# Patient Record
Sex: Male | Born: 1970 | Race: Black or African American | Hispanic: No | Marital: Married | Smoking: Never smoker
Health system: Southern US, Community
[De-identification: ages and names within clinical notes are randomized; demographics above are authoritative.]

## PROBLEM LIST (undated history)

## (undated) DIAGNOSIS — D869 Sarcoidosis, unspecified: Secondary | ICD-10-CM

## (undated) DIAGNOSIS — I10 Essential (primary) hypertension: Secondary | ICD-10-CM

## (undated) DIAGNOSIS — T7840XA Allergy, unspecified, initial encounter: Secondary | ICD-10-CM

## (undated) HISTORY — DX: Allergy, unspecified, initial encounter: T78.40XA

---

## 2004-03-15 ENCOUNTER — Emergency Department (HOSPITAL_COMMUNITY): Admission: EM | Admit: 2004-03-15 | Discharge: 2004-03-15 | Payer: Self-pay | Admitting: Emergency Medicine

## 2004-10-03 ENCOUNTER — Emergency Department (HOSPITAL_COMMUNITY): Admission: EM | Admit: 2004-10-03 | Discharge: 2004-10-03 | Payer: Self-pay | Admitting: Emergency Medicine

## 2004-11-17 ENCOUNTER — Emergency Department (HOSPITAL_COMMUNITY): Admission: EM | Admit: 2004-11-17 | Discharge: 2004-11-18 | Payer: Self-pay | Admitting: Emergency Medicine

## 2004-11-18 ENCOUNTER — Emergency Department (HOSPITAL_COMMUNITY): Admission: EM | Admit: 2004-11-18 | Discharge: 2004-11-18 | Payer: Self-pay | Admitting: Emergency Medicine

## 2004-12-07 ENCOUNTER — Emergency Department (HOSPITAL_COMMUNITY): Admission: EM | Admit: 2004-12-07 | Discharge: 2004-12-07 | Payer: Self-pay

## 2005-02-15 ENCOUNTER — Emergency Department (HOSPITAL_COMMUNITY): Admission: EM | Admit: 2005-02-15 | Discharge: 2005-02-15 | Payer: Self-pay | Admitting: Emergency Medicine

## 2005-07-23 ENCOUNTER — Emergency Department (HOSPITAL_COMMUNITY): Admission: EM | Admit: 2005-07-23 | Discharge: 2005-07-23 | Payer: Self-pay | Admitting: Emergency Medicine

## 2006-03-03 ENCOUNTER — Emergency Department (HOSPITAL_COMMUNITY): Admission: EM | Admit: 2006-03-03 | Discharge: 2006-03-03 | Payer: Self-pay | Admitting: Emergency Medicine

## 2006-08-08 ENCOUNTER — Emergency Department (HOSPITAL_COMMUNITY): Admission: EM | Admit: 2006-08-08 | Discharge: 2006-08-08 | Payer: Self-pay | Admitting: Emergency Medicine

## 2006-10-10 ENCOUNTER — Emergency Department (HOSPITAL_COMMUNITY): Admission: EM | Admit: 2006-10-10 | Discharge: 2006-10-10 | Payer: Self-pay | Admitting: Emergency Medicine

## 2007-03-18 ENCOUNTER — Encounter: Admission: RE | Admit: 2007-03-18 | Discharge: 2007-03-18 | Payer: Self-pay | Admitting: Family Medicine

## 2007-08-11 ENCOUNTER — Emergency Department (HOSPITAL_COMMUNITY): Admission: EM | Admit: 2007-08-11 | Discharge: 2007-08-11 | Payer: Self-pay | Admitting: Family Medicine

## 2008-01-30 ENCOUNTER — Emergency Department (HOSPITAL_COMMUNITY): Admission: EM | Admit: 2008-01-30 | Discharge: 2008-01-30 | Payer: Self-pay | Admitting: Emergency Medicine

## 2008-04-13 ENCOUNTER — Emergency Department (HOSPITAL_COMMUNITY): Admission: EM | Admit: 2008-04-13 | Discharge: 2008-04-13 | Payer: Self-pay | Admitting: Emergency Medicine

## 2009-01-13 ENCOUNTER — Emergency Department (HOSPITAL_COMMUNITY): Admission: EM | Admit: 2009-01-13 | Discharge: 2009-01-13 | Payer: Self-pay | Admitting: Family Medicine

## 2009-02-04 ENCOUNTER — Emergency Department (HOSPITAL_COMMUNITY): Admission: EM | Admit: 2009-02-04 | Discharge: 2009-02-04 | Payer: Self-pay | Admitting: Emergency Medicine

## 2009-08-05 ENCOUNTER — Emergency Department (HOSPITAL_COMMUNITY): Admission: EM | Admit: 2009-08-05 | Discharge: 2009-08-05 | Payer: Self-pay | Admitting: Family Medicine

## 2010-04-20 ENCOUNTER — Ambulatory Visit: Payer: Self-pay | Admitting: Internal Medicine

## 2010-04-20 DIAGNOSIS — I10 Essential (primary) hypertension: Secondary | ICD-10-CM | POA: Insufficient documentation

## 2010-04-20 DIAGNOSIS — D869 Sarcoidosis, unspecified: Secondary | ICD-10-CM

## 2011-01-10 NOTE — Assessment & Plan Note (Signed)
Summary: Pulmonary/ new pt eval with sarcoidosis   Visit Type:  Initial Consult Copy to:  Dr. Jimmye Norman Primary Provider/Referring Provider:  Dr. Jimmye Norman  CC:  Sarcoid.  History of Present Illness: 38 yobm quit smoking when dx'd with sarcoid 1998 in [place] with Iritis and pos lymph node bx by mediastinoscopy  never needed prednisone.  Apr 20, 2010 cc chest pain with twisting and turning x 1 week, no new eye problems, no new arthritisi or rash, no sob, no cough.  Pt denies any significant sore throat, dysphagia, itching, sneezing,  nasal congestion or excess secretions,  fever, chills, sweats, unintended wt loss, pleuritic or exertional cp, hempoptysis, change in activity tolerance  orthopnea pnd or leg swelling.  Pt also denies any obvious fluctuation in symptoms with weather or environmental change or other alleviating or aggravating factors.        Current Medications (verified): 1)  Hydrochlorothiazide 12.5 Mg Caps (Hydrochlorothiazide) .Marland Kitchen.. 1 Once Daily 2)  Flarex 0.1 % Susp (Fluorometholone Acetate) .... 4 Drops Daily To Eac Eye As Needed  Allergies (verified): No Known Drug Allergies  Past History:  Past Medical History: Hypertension Sarcoidosis..............................................Marland KitchenWert     - dx 1998  [place] by mediastinoscopy  Family History: Stomach CA- Father Sarcoid- Mother  Social History: Married Children Works for [employer] Former smoker.  Quit in 1998.  Smoked for approx 4 yrs up to 1/2 ppd. ETOH 2 x per wk  Review of Systems       The patient complains of chest pain, acid heartburn, indigestion, headaches, nasal congestion/difficulty breathing through nose, anxiety, and joint stiffness or pain.  The patient denies shortness of breath with activity, shortness of breath at rest, productive cough, non-productive cough, coughing up blood, irregular heartbeats, loss of appetite, weight change, abdominal pain, difficulty swallowing,  sore throat, tooth/dental problems, sneezing, itching, ear ache, depression, hand/feet swelling, rash, change in color of mucus, and fever.    Vital Signs:  Patient profile:   40 year old male Height:      75 inches Weight:      301 pounds BMI:     37.76 O2 Sat:      98 % on Room air Temp:     98.2 degrees F oral Pulse rate:   80 / minute BP sitting:   124 / 86  (left arm) Cuff size:   large  Vitals Entered ByVernie Murders (Apr 20, 2010 3:40 PM)  O2 Flow:  Room air  Physical Exam  Additional Exam:  Healthy appearing mod obese bm nad wt 301 Apr 20, 2010  HEENT: nl dentition, turbinates, and orophanx. Nl external ear canals without cough reflex NECK :  without JVD/Nodes/TM/ nl carotid upstrokes bilaterally LUNGS: no acc muscle use, clear to A and P bilaterally without cough on insp or exp maneuvers CV:  RRR  no s3 or murmur or increase in P2, no edema  ABD:  soft and nontender with nl excursion in the supine position. No bruits or organomegaly, bowel sounds nl MS:  warm without deformities, calf tenderness, cyanosis or clubbing SKIN: warm and dry with hyperpigmented macular changes both ext elbows  NEURO:  alert, approp, no deficits     CXR  Procedure date:  04/20/2010  Findings:      Comparison: 03/18/2007   Findings: Normal heart size and vascularity.  No focal pneumonia, edema, airspace disease, effusion or pneumothorax.  Midline trachea.  No adenopathy by plain radiography.  Stable exam.   IMPRESSION:  Stable chest exam.  No acute process.  Impression & Recommendations:  Problem # 1:  SARCOIDOSIS (ICD-135) A good general rule of thumb is that 95% of pts with active sarcoid have an abn cxr (which he doesn't) and most pts with sarcoid with abn cxr don't needed to be treated with steroids because there's little evidence that we change the natural hx of the disease with steroids but cause considerable collateral damage with this drug chronically.   therefore f/u  yearly with cxr and ocular exam all that's required.  Medications Added to Medication List This Visit: 1)  Hydrochlorothiazide 12.5 Mg Caps (Hydrochlorothiazide) .Marland Kitchen.. 1 once daily 2)  Flarex 0.1 % Susp (Fluorometholone acetate) .... 4 drops daily to eac eye as needed  Other Orders: T-2 View CXR (71020TC) Consultation Level IV (96295)  Patient Instructions: 1)  Yearly cxr in context of a regular exam and eye exam 2)  Call sooner for unintended weight loss, unexplained sweats and fever, short of breath  or cough   CXR  Procedure date:  04/20/2010  Findings:      Comparison: 03/18/2007   Findings: Normal heart size and vascularity.  No focal pneumonia, edema, airspace disease, effusion or pneumothorax.  Midline trachea.  No adenopathy by plain radiography.  Stable exam.   IMPRESSION: Stable chest exam.  No acute process.

## 2011-09-01 LAB — POCT URINALYSIS DIP (DEVICE)
Ketones, ur: NEGATIVE
Nitrite: NEGATIVE
Protein, ur: NEGATIVE

## 2011-09-01 LAB — URINE CULTURE: Colony Count: NO GROWTH

## 2011-09-01 LAB — GC/CHLAMYDIA PROBE AMP, GENITAL
Chlamydia, DNA Probe: NEGATIVE
GC Probe Amp, Genital: NEGATIVE

## 2011-09-22 LAB — POCT URINALYSIS DIP (DEVICE)
Glucose, UA: NEGATIVE
Protein, ur: NEGATIVE
Urobilinogen, UA: 0.2

## 2011-10-21 ENCOUNTER — Emergency Department (INDEPENDENT_AMBULATORY_CARE_PROVIDER_SITE_OTHER)
Admission: EM | Admit: 2011-10-21 | Discharge: 2011-10-21 | Disposition: A | Discharge status: Home / Self Care | Payer: BC Managed Care – PPO | Source: Home / Self Care | Admitting: Family Medicine

## 2011-10-21 DIAGNOSIS — T7840XA Allergy, unspecified, initial encounter: Secondary | ICD-10-CM

## 2011-10-21 HISTORY — DX: Sarcoidosis, unspecified: D86.9

## 2011-10-21 HISTORY — DX: Essential (primary) hypertension: I10

## 2011-10-21 NOTE — ED Provider Notes (Signed)
Medical screening examination/treatment/procedure(s) were performed by non-physician practitioner and as supervising physician I was immediately available for consultation/collaboration.   [facility]; MD   Mariyana Fulop Moreno-Coll 10/21/11 1478

## 2011-10-21 NOTE — ED Provider Notes (Signed)
History     CSN: [id number] Arrival date & time: 10/21/2011  9:36 AM   First MD Initiated Contact with Patient 10/21/11 1005      Chief Complaint  Patient presents with  . Allergic Reaction    Pt has burning and swelling of lips that started this am and denies new meds, concerned because he had oral sex last pm    (Consider location/radiation/quality/duration/timing/severity/associated sxs/prior treatment) HPI Comments: Pt woke up this morning feeling like his lips were burning and swollen; feels almost completely better now; tx self by using lip balm; denies any new foods or medications  Patient is a 40 y.o. male presenting with hypersensitivity reaction. The history is provided by the patient.  Hypersensitivity Reaction The primary symptoms are  angioedema. The primary symptoms do not include wheezing, shortness of breath or rash. The current episode started 3 to 5 hours ago. The problem has been rapidly improving. This is a new problem.  The angioedema is not associated with shortness of breath or stridor.  Significant symptoms that are not present include eye redness, flushing, rhinorrhea or itching.    Past Medical History  Diagnosis Date  . Hypertension   . Sarcoidosis     History reviewed. No pertinent past surgical history.  History reviewed. No pertinent family history.  History  Substance Use Topics  . Smoking status: Never Smoker   . Smokeless tobacco: Never Used  . Alcohol Use: Yes      Review of Systems  Constitutional: Negative for fever and chills.  HENT: Negative for facial swelling, rhinorrhea and trouble swallowing.   Eyes: Negative for redness.  Respiratory: Negative for chest tightness, shortness of breath, wheezing and stridor.   Cardiovascular: Negative for chest pain.  Skin: Negative for flushing, itching and rash.    Allergies  Review of patient's allergies indicates no known allergies.  Home Medications   Current Outpatient Rx  Name  Route Sig Dispense Refill  . HYDROCHLOROTHIAZIDE 12.5 MG PO TABS Oral Take 12.5 mg by mouth daily.        BP 136/90  Pulse 83  Temp(Src) 98.6 F (37 C) (Oral)  Resp 17  SpO2 96%  Physical Exam  Constitutional: He appears well-developed and well-nourished. No distress.  HENT:  Head: Normocephalic.  Mouth/Throat: No uvula swelling. No oropharyngeal exudate or posterior oropharyngeal edema.       No lip swelling noted  Pulmonary/Chest: Effort normal and breath sounds normal.    ED Course  Procedures (including critical care time)  Labs Reviewed - No data to display No results found.   No diagnosis found.    MDM  No evidence of allergic reaction at this time.  Pt does feel his lips were very dry and sx were relieved with lip balm.  Allergic reaction vs dry lips.         Cathlyn Parsons, NP 10/21/11 1058

## 2014-09-17 ENCOUNTER — Encounter (HOSPITAL_COMMUNITY): Payer: Self-pay | Admitting: Emergency Medicine

## 2014-09-17 ENCOUNTER — Emergency Department (HOSPITAL_COMMUNITY)
Admission: EM | Admit: 2014-09-17 | Discharge: 2014-09-17 | Disposition: A | Discharge status: Home / Self Care | Payer: BC Managed Care – PPO | Attending: Emergency Medicine | Admitting: Emergency Medicine

## 2014-09-17 DIAGNOSIS — Z862 Personal history of diseases of the blood and blood-forming organs and certain disorders involving the immune mechanism: Secondary | ICD-10-CM | POA: Insufficient documentation

## 2014-09-17 DIAGNOSIS — Z79899 Other long term (current) drug therapy: Secondary | ICD-10-CM | POA: Insufficient documentation

## 2014-09-17 DIAGNOSIS — I10 Essential (primary) hypertension: Secondary | ICD-10-CM | POA: Insufficient documentation

## 2014-09-17 DIAGNOSIS — M545 Low back pain: Secondary | ICD-10-CM | POA: Insufficient documentation

## 2014-09-17 MED ORDER — IBUPROFEN 800 MG PO TABS: 800.0000 mg | ORAL_TABLET | Freq: Three times a day (TID) | ORAL | Status: DC | PRN

## 2014-09-17 MED ORDER — CYCLOBENZAPRINE HCL 10 MG PO TABS: 10.0000 mg | ORAL_TABLET | Freq: Three times a day (TID) | ORAL | Status: DC | PRN

## 2014-09-17 MED ORDER — OXYCODONE-ACETAMINOPHEN 5-325 MG PO TABS: 1.0000 | ORAL_TABLET | Freq: Three times a day (TID) | ORAL | Status: DC | PRN

## 2014-09-17 NOTE — ED Provider Notes (Signed)
CSN: [id number]     Arrival date & time 09/17/14  3790 History  This chart was scribed for non-physician practitioner Domenic Moras, working with Tanna Furry, MD by Donato Schultz, ED Scribe. This patient was seen in room TR06C/TR06C and the patient's care was started at 10:45 AM.   No chief complaint on file.   The history is provided by the patient. No language interpreter was used.   HPI Comments: Aaron Jacobs is a 43 y.o. male with a 10 year history of intermittent lower back spasms and sarcoidosis who presents to the Emergency Department complaining of gradually worsening lower back pain with associated spasms with intermittent radiation to the anterior aspect of his thighs bilaterally that started 2 weeks ago.  He rates the pain at a 3/10 but states that it was a 10/10 yesterday.  He states that positional changes and standing up after sitting aggravate the pain.  He has been taking Ibuprofen with mild relief to his symptoms and Flexeril with temporary relief, but improvement to his symptoms.  He used to go to physical therapy for his symptoms and saw improvement in his pain however he has not gone in the last 3 years.  He denies incontinence, hematuria, and numbness in his legs bilaterally as an associated symptom.  He does not have a history of IV drug use or cancer.  He has never used medication to treat the sarcoidosis.  He has never seen an orthopedist.  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     Past Medical History  Diagnosis Date  . Hypertension   . Sarcoidosis    History reviewed. No pertinent past surgical history. History reviewed. No pertinent family history. History  Substance Use Topics  . Smoking status: Never Smoker   . Smokeless tobacco: Never Used  . Alcohol Use: Yes    Review of Systems  Genitourinary: Negative for hematuria.  Musculoskeletal: Positive for back pain.  Neurological: Negative for numbness.  All other systems  reviewed and are negative.     Allergies  Review of patient's allergies indicates no known allergies.  Home Medications   Prior to Admission medications   Medication Sig Start Date End Date Taking? Authorizing Provider  cyclobenzaprine (FLEXERIL) 10 MG tablet Take 10 mg by mouth 3 (three) times daily as needed for muscle spasms.   Yes Historical Provider, MD  hydrochlorothiazide (HYDRODIURIL) 12.5 MG tablet Take 12.5 mg by mouth daily.     Yes Historical Provider, MD   BP 152/93  Pulse 92  Temp(Src) 98.6 F (37 C) (Oral)  Resp 20  Ht 6\' 4"  (1.93 m)  Wt 305 lb (138.347 kg)  BMI 37.14 kg/m2  SpO2 95% Physical Exam  Nursing note and vitals reviewed. Constitutional: He is oriented to person, place, and time. He appears well-developed and well-nourished.  HENT:  Head: Normocephalic and atraumatic.  Eyes: EOM are normal.  Neck: Normal range of motion.  Cardiovascular: Normal rate.   Pulmonary/Chest: Effort normal.  Musculoskeletal: Normal range of motion.  Mild para lumbar spine tenderness on palpation.  No significant midline spine tenderness, crepitus, or step off.  Negative straight leg raise.   Neurological: He is alert and oriented to person, place, and time. He has normal reflexes. Coordination normal.  Skin: Skin is warm and dry. No rash noted.  No signs of infection.  Psychiatric: He has a normal mood and affect. His behavior is normal.    ED Course  Procedures (including critical care time)  DIAGNOSTIC STUDIES: Oxygen Saturation is 95% on room air, adequate by my interpretation.    COORDINATION OF CARE: 11:02 AM- acute on chronic low back pain, no red flags, ambulate without difficulty, nvi.  Will discharge the patient with pain medication and a referral to an orthopedist.  Advised the patient to follow-up with physical therapy and discussed return to clinic precautions.  The patient agreed to the treatment plan.  Labs Review Labs Reviewed - No data to  display  Imaging Review No results found.   EKG Interpretation None      MDM   Final diagnoses:  Low back pain without sciatica, unspecified back pain laterality    BP 152/93  Pulse 92  Temp(Src) 98.6 F (37 C) (Oral)  Resp 20  Ht 6\' 4"  (1.93 m)  Wt 305 lb (138.347 kg)  BMI 37.14 kg/m2  SpO2 95%   I personally performed the services described in this documentation, which was scribed in my presence. The recorded information has been reviewed and is accurate.     Domenic Moras, PA-C 09/17/14 1111

## 2014-09-17 NOTE — ED Notes (Signed)
Pt presents with 2 week h/o low back spasms.  Pt denies any injury but reports lifting at his job. Pt reports pain radiates down L leg; pt denies any bladder/bowel incontinence, reports taking flexeril last night which relieved pain only to awake with tightening in back again.

## 2014-09-17 NOTE — Discharge Instructions (Signed)
Back Pain, Adult Low back pain is very common. About 1 in 5 people have back pain.The cause of low back pain is rarely dangerous. The pain often gets better over time.About half of people with a sudden onset of back pain feel better in just 2 weeks. About 8 in 10 people feel better by 6 weeks.  CAUSES Some common causes of back pain include:  Strain of the muscles or ligaments supporting the spine.  Wear and tear (degeneration) of the spinal discs.  Arthritis.  Direct injury to the back. DIAGNOSIS Most of the time, the direct cause of low back pain is not known.However, back pain can be treated effectively even when the exact cause of the pain is unknown.Answering your caregiver's questions about your overall health and symptoms is one of the most accurate ways to make sure the cause of your pain is not dangerous. If your caregiver needs more information, he or she may order lab work or imaging tests (X-rays or MRIs).However, even if imaging tests show changes in your back, this usually does not require surgery. HOME CARE INSTRUCTIONS For many people, back pain returns.Since low back pain is rarely dangerous, it is often a condition that people can learn to manageon their own.   Remain active. It is stressful on the back to sit or stand in one place. Do not sit, drive, or stand in one place for more than 30 minutes at a time. Take short walks on level surfaces as soon as pain allows.Try to increase the length of time you walk each day.  Do not stay in bed.Resting more than 1 or 2 days can delay your recovery.  Do not avoid exercise or work.Your body is made to move.It is not dangerous to be active, even though your back may hurt.Your back will likely heal faster if you return to being active before your pain is gone.  Pay attention to your body when you bend and lift. Many people have less discomfortwhen lifting if they bend their knees, keep the load close to their bodies,and  avoid twisting. Often, the most comfortable positions are those that put less stress on your recovering back.  Find a comfortable position to sleep. Use a firm mattress and lie on your side with your knees slightly bent. If you lie on your back, put a pillow under your knees.  Only take over-the-counter or prescription medicines as directed by your caregiver. Over-the-counter medicines to reduce pain and inflammation are often the most helpful.Your caregiver may prescribe muscle relaxant drugs.These medicines help dull your pain so you can more quickly return to your normal activities and healthy exercise.  Put ice on the injured area.  Put ice in a plastic bag.  Place a towel between your skin and the bag.  Leave the ice on for 15-20 minutes, 03-04 times a day for the first 2 to 3 days. After that, ice and heat may be alternated to reduce pain and spasms.  Ask your caregiver about trying back exercises and gentle massage. This may be of some benefit.  Avoid feeling anxious or stressed.Stress increases muscle tension and can worsen back pain.It is important to recognize when you are anxious or stressed and learn ways to manage it.Exercise is a great option. SEEK MEDICAL CARE IF:  You have pain that is not relieved with rest or medicine.  You have pain that does not improve in 1 week.  You have new symptoms.  You are generally not feeling well. SEEK   IMMEDIATE MEDICAL CARE IF:   You have pain that radiates from your back into your legs.  You develop new bowel or bladder control problems.  You have unusual weakness or numbness in your arms or legs.  You develop nausea or vomiting.  You develop abdominal pain.  You feel faint. Document Released: 11/27/2005 Document Revised: 05/28/2012 Document Reviewed: 03/31/2014 Exit[employer] Patient Information 2015 [employer]. This information is not intended to replace advice given to you by your health care provider. Make sure you  discuss any questions you have with your health care provider.  

## 2014-09-25 NOTE — ED Provider Notes (Signed)
Medical screening examination/treatment/procedure(s) were performed by non-physician practitioner and as supervising physician I was immediately available for consultation/collaboration.   EKG Interpretation None        Tanna Furry, MD 09/25/14 [id number]

## 2015-04-03 ENCOUNTER — Emergency Department (HOSPITAL_COMMUNITY)
Admission: EM | Admit: 2015-04-03 | Discharge: 2015-04-03 | Disposition: A | Discharge status: Home / Self Care | Payer: Self-pay | Attending: Emergency Medicine | Admitting: Emergency Medicine

## 2015-04-03 ENCOUNTER — Encounter (HOSPITAL_COMMUNITY): Payer: Self-pay | Admitting: Intensive Care

## 2015-04-03 ENCOUNTER — Emergency Department (HOSPITAL_COMMUNITY): Payer: Self-pay

## 2015-04-03 DIAGNOSIS — R079 Chest pain, unspecified: Secondary | ICD-10-CM

## 2015-04-03 DIAGNOSIS — Z862 Personal history of diseases of the blood and blood-forming organs and certain disorders involving the immune mechanism: Secondary | ICD-10-CM | POA: Insufficient documentation

## 2015-04-03 DIAGNOSIS — R0789 Other chest pain: Secondary | ICD-10-CM | POA: Insufficient documentation

## 2015-04-03 DIAGNOSIS — Z79899 Other long term (current) drug therapy: Secondary | ICD-10-CM | POA: Insufficient documentation

## 2015-04-03 DIAGNOSIS — R0602 Shortness of breath: Secondary | ICD-10-CM | POA: Insufficient documentation

## 2015-04-03 DIAGNOSIS — I10 Essential (primary) hypertension: Secondary | ICD-10-CM | POA: Insufficient documentation

## 2015-04-03 LAB — BASIC METABOLIC PANEL
Anion gap: 11 (ref 5–15)
BUN: 11 mg/dL (ref 6–23)
CO2: 22 mmol/L (ref 19–32)
Calcium: 9.3 mg/dL (ref 8.4–10.5)
Chloride: 106 mmol/L (ref 96–112)
Creatinine, Ser: 0.92 mg/dL (ref 0.50–1.35)
GFR calc Af Amer: >90 mL/min (ref >90)
GLUCOSE: 108 mg/dL — AB (ref 70–99)
POTASSIUM: 3.8 mmol/L (ref 3.5–5.1)
Sodium: 139 mmol/L (ref 135–145)

## 2015-04-03 LAB — CBC WITH DIFFERENTIAL/PLATELET
Basophils Absolute: 0 10*3/uL (ref 0.0–0.1)
Basophils Relative: 0 % (ref 0–1)
EOS ABS: 0.1 10*3/uL (ref 0.0–0.7)
EOS PCT: 3 % (ref 0–5)
HCT: 40.3 % (ref 39.0–52.0)
HEMOGLOBIN: 13.4 g/dL (ref 13.0–17.0)
LYMPHS ABS: 2.5 10*3/uL (ref 0.7–4.0)
LYMPHS PCT: 48 % — AB (ref 12–46)
MCH: 25.4 pg — AB (ref 26.0–34.0)
MCHC: 33.3 g/dL (ref 30.0–36.0)
MCV: 76.5 fL — AB (ref 78.0–100.0)
MONOS PCT: 6 % (ref 3–12)
Monocytes Absolute: 0.3 10*3/uL (ref 0.1–1.0)
Neutro Abs: 2.2 10*3/uL (ref 1.7–7.7)
Neutrophils Relative %: 43 % (ref 43–77)
Platelets: 147 10*3/uL — ABNORMAL LOW (ref 150–400)
RBC: 5.27 MIL/uL (ref 4.22–5.81)
RDW: 14.3 % (ref 11.5–15.5)
WBC: 5.1 10*3/uL (ref 4.0–10.5)

## 2015-04-03 LAB — D-DIMER, QUANTITATIVE (NOT AT ARMC)

## 2015-04-03 LAB — BRAIN NATRIURETIC PEPTIDE: B Natriuretic Peptide: 5.8 pg/mL (ref 0.0–100.0)

## 2015-04-03 LAB — TROPONIN I: Troponin I: <0.03 ng/mL (ref <0.031)

## 2015-04-03 MED ORDER — TRAMADOL HCL 50 MG PO TABS: 50.0000 mg | ORAL_TABLET | Freq: Four times a day (QID) | ORAL | Status: DC | PRN

## 2015-04-03 NOTE — ED Notes (Signed)
Pt C/O chest pain followed by SOB. Pt states this started two days ago. Patient reports 1 episode of lightheadedness.

## 2015-04-03 NOTE — ED Provider Notes (Signed)
CSN: [id number]     Arrival date & time 04/03/15  1039 History   First MD Initiated Contact with Patient 04/03/15 1042     Chief Complaint  Patient presents with  . Shortness of Breath  . Chest Pain     (Consider location/radiation/quality/duration/timing/severity/associated sxs/prior Treatment) HPI Comments: Patient presents to the emergency department for evaluation of chest pain and shortness of breath. Patient reports that he has been having intermittent episodes of chest pain over the last 2 days. He reports that the pain can last for a few minutes up to 30 minutes. It is accompanied by shortness of breath. He has noticed that exertion brings on the symptoms. He does not have any history of heart disease. There is no family history of heart disease. Patient does have history of sarcoid, but it is not in his lungs. He has not had any lower extremity swelling or calf pain or swelling.  Patient is a 44 y.o. male presenting with shortness of breath and chest pain.  Shortness of Breath Associated symptoms: chest pain   Chest Pain Associated symptoms: shortness of breath     Past Medical History  Diagnosis Date  . Hypertension   . Sarcoidosis    History reviewed. No pertinent past surgical history. History reviewed. No pertinent family history. History  Substance Use Topics  . Smoking status: Never Smoker   . Smokeless tobacco: Never Used  . Alcohol Use: Yes     Comment: occassionally    Review of Systems  Respiratory: Positive for shortness of breath.   Cardiovascular: Positive for chest pain.  All other systems reviewed and are negative.     Allergies  Review of patient's allergies indicates no known allergies.  Home Medications   Prior to Admission medications   Medication Sig Start Date End Date Taking? Authorizing Provider  hydrochlorothiazide (HYDRODIURIL) 12.5 MG tablet Take 12.5 mg by mouth daily.     Yes Historical Provider, MD  ibuprofen (ADVIL,MOTRIN) 800  MG tablet Take 1 tablet (800 mg total) by mouth every 8 (eight) hours as needed. 09/17/14  Yes Domenic Moras, PA-C  cyclobenzaprine (FLEXERIL) 10 MG tablet Take 1 tablet (10 mg total) by mouth 3 (three) times daily as needed for muscle spasms. Patient not taking: Reported on 04/03/2015 09/17/14   Domenic Moras, PA-C  oxyCODONE-acetaminophen (PERCOCET/ROXICET) 5-325 MG per tablet Take 1 tablet by mouth every 8 (eight) hours as needed for severe pain. Patient not taking: Reported on 04/03/2015 09/17/14   Domenic Moras, PA-C   BP 150/84 mmHg  Pulse 73  Temp(Src) 98.3 F (36.8 C) (Oral)  Resp 12  Wt 305 lb (138.347 kg)  SpO2 97% Physical Exam  Constitutional: He is oriented to person, place, and time. He appears well-developed and well-nourished. No distress.  HENT:  Head: Normocephalic and atraumatic.  Right Ear: Hearing normal.  Left Ear: Hearing normal.  Nose: Nose normal.  Mouth/Throat: Oropharynx is clear and moist and mucous membranes are normal.  Eyes: Conjunctivae and EOM are normal. Pupils are equal, round, and reactive to light.  Neck: Normal range of motion. Neck supple.  Cardiovascular: Regular rhythm, S1 normal and S2 normal.  Exam reveals no gallop and no friction rub.   No murmur heard. Pulmonary/Chest: Effort normal and breath sounds normal. No respiratory distress. He exhibits no tenderness.  Abdominal: Soft. Normal appearance and bowel sounds are normal. There is no hepatosplenomegaly. There is no tenderness. There is no rebound, no guarding, no tenderness at McBurney's point and negative  Murphy's sign. No hernia.  Musculoskeletal: Normal range of motion.  Neurological: He is alert and oriented to person, place, and time. He has normal strength. No cranial nerve deficit or sensory deficit. Coordination normal. GCS eye subscore is 4. GCS verbal subscore is 5. GCS motor subscore is 6.  Skin: Skin is warm, dry and intact. No rash noted. No cyanosis.  Psychiatric: He has a normal mood and  affect. His speech is normal and behavior is normal. Thought content normal.  Nursing note and vitals reviewed.   ED Course  Procedures (including critical care time) Labs Review Labs Reviewed  CBC WITH DIFFERENTIAL/PLATELET - Abnormal; Notable for the following:    MCV 76.5 (*)    MCH 25.4 (*)    Platelets 147 (*)    Lymphocytes Relative 48 (*)    All other components within normal limits  BASIC METABOLIC PANEL - Abnormal; Notable for the following:    Glucose, Bld 108 (*)    All other components within normal limits  TROPONIN I  BRAIN NATRIURETIC PEPTIDE  D-DIMER, QUANTITATIVE    Imaging Review Dg Chest 2 View  04/03/2015   CLINICAL DATA:  Left-sided chest pain, shortness of breath, lightheadedness  EXAM: CHEST  2 VIEW  COMPARISON:  04/20/2010  FINDINGS: The heart size and mediastinal contours are within normal limits. Both lungs are clear. The visualized skeletal structures are unremarkable.  IMPRESSION: No active cardiopulmonary disease.   Electronically Signed   By: Conchita Paris M.D.   On: 04/03/2015 12:29     EKG Interpretation   Date/Time:  Saturday April 03 2015 10:43:15 EDT Ventricular Rate:  84 PR Interval:  150 QRS Duration: 76 QT Interval:  358 QTC Calculation: 423 R Axis:   73 Text Interpretation:  Normal sinus rhythm with sinus arrhythmia  Nonspecific T wave abnormality Abnormal ECG No significant change since  last tracing Confirmed by POLLINA  MD, [place] [phone]) on 04/03/2015  10:57:32 AM      MDM   Final diagnoses:  Chest pain   Patient presents to the ER for evaluation of chest pain. Patient has been experiencing intermittent episodes of chest pain for the last 2 days. Episodes come on intermittently and last for only a few minutes. He has noticed that exertion has brought on the episodes at times. They can occur without exertion as well. He has associated shortness of breath. Patient has an isolated cardiac risk factor of hypertension. HEART  score is 1.  Patient's EKG does not show any significant abnormality, no change since prior EKG. Troponin was negative. Remainder of workup was negative. This included a negative d-dimer. Patient is not hypoxic or tachycardic. Patient is felt to be low risk for cardiac etiology based on his HEART score. Second troponin obtained and is negative. Patient will follow-up with primary care this week as an outpatient, return if his symptoms worsen.    Orpah Greek, MD 04/03/15 [phone]

## 2015-04-03 NOTE — Discharge Instructions (Signed)

## 2015-04-03 NOTE — ED Notes (Signed)
DISCHARGED VITALS DONE.

## 2015-04-03 NOTE — ED Notes (Signed)
Pt ambulatory to restroom with no distress noted  

## 2016-11-04 IMAGING — CR DG CHEST 2V
2 series · 2 of 2 positions shown · non-contrast
Comparison: 04/20/2010

CLINICAL DATA: Left-sided chest pain, shortness of breath,
lightheadedness

EXAM:
CHEST  2 VIEW

[chest pa]
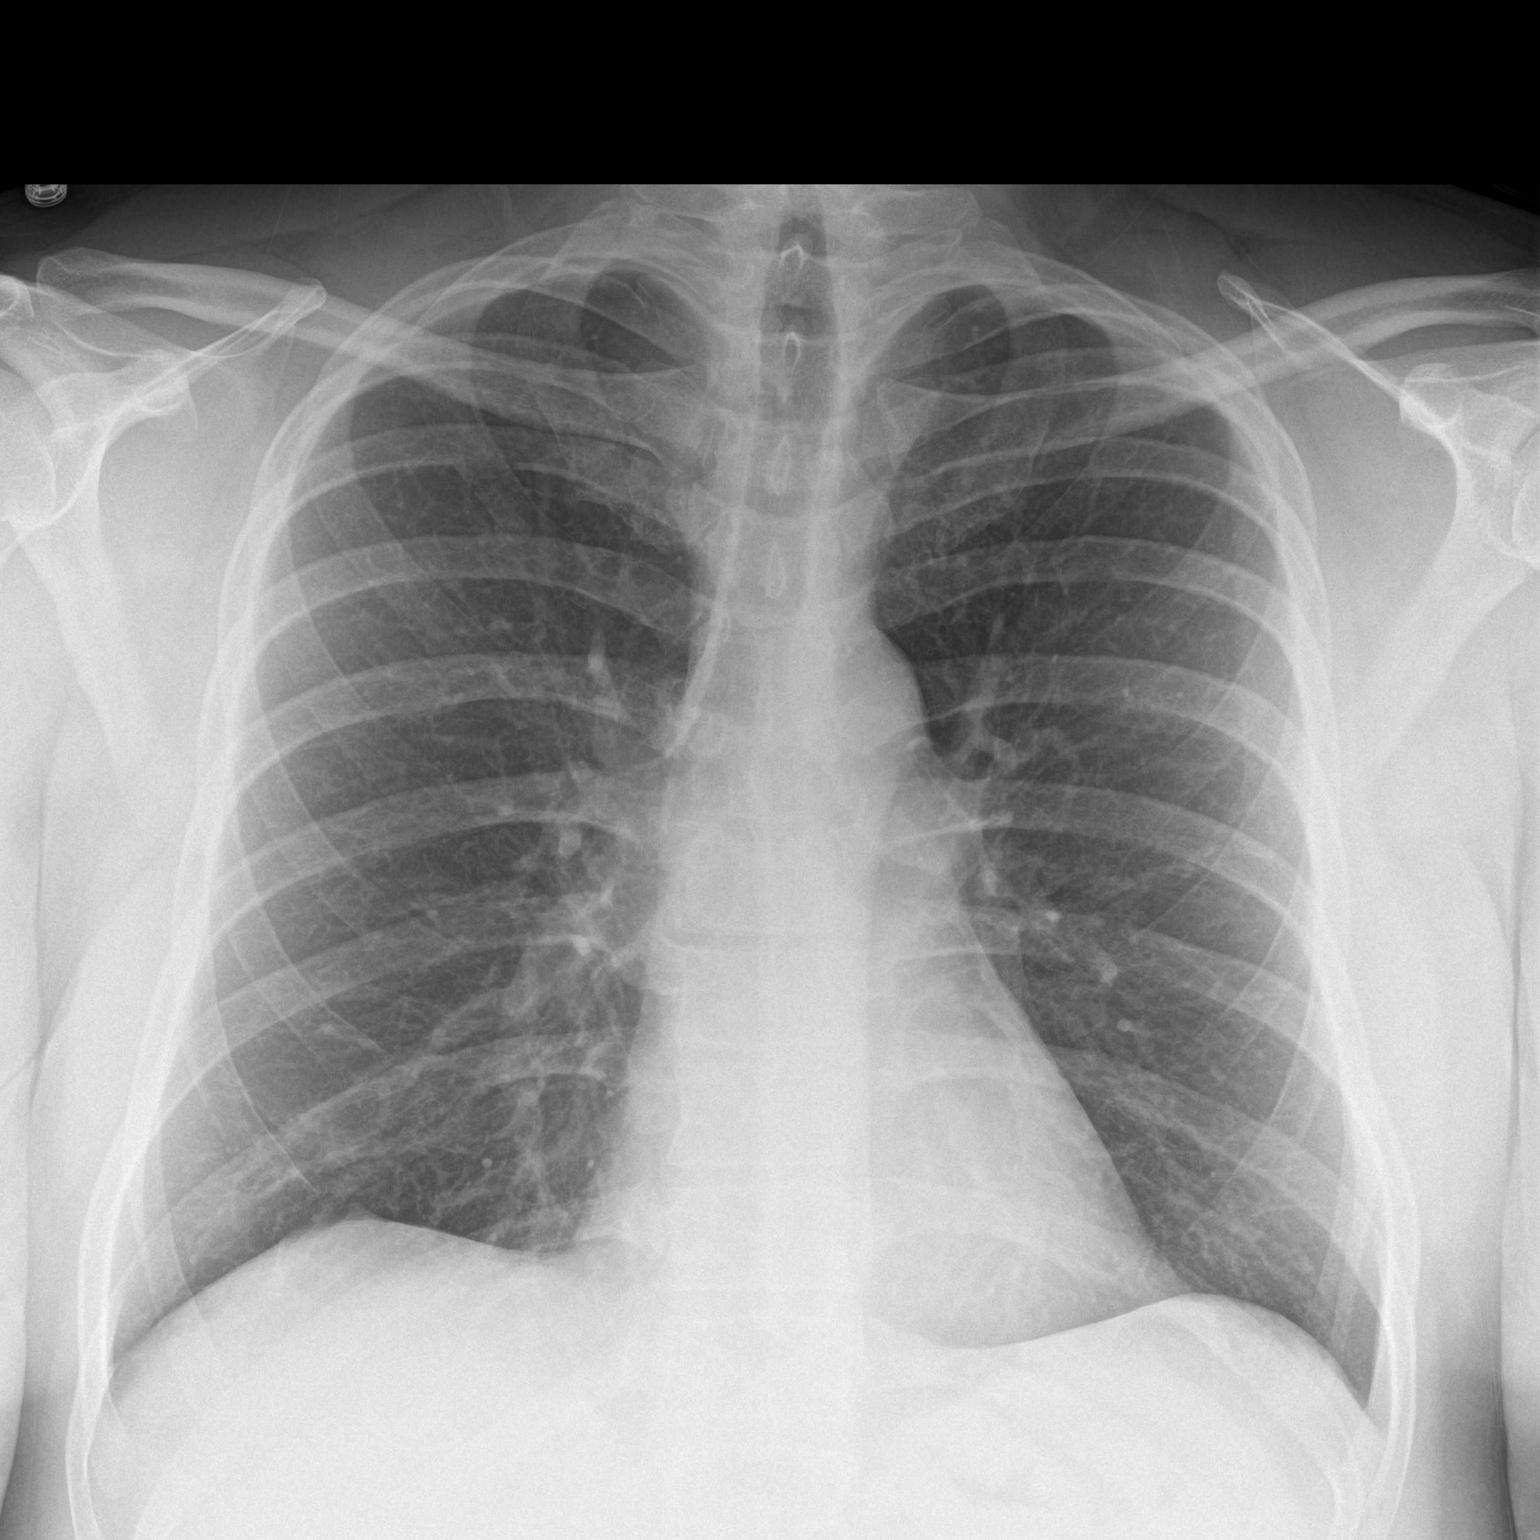

[chest lat]
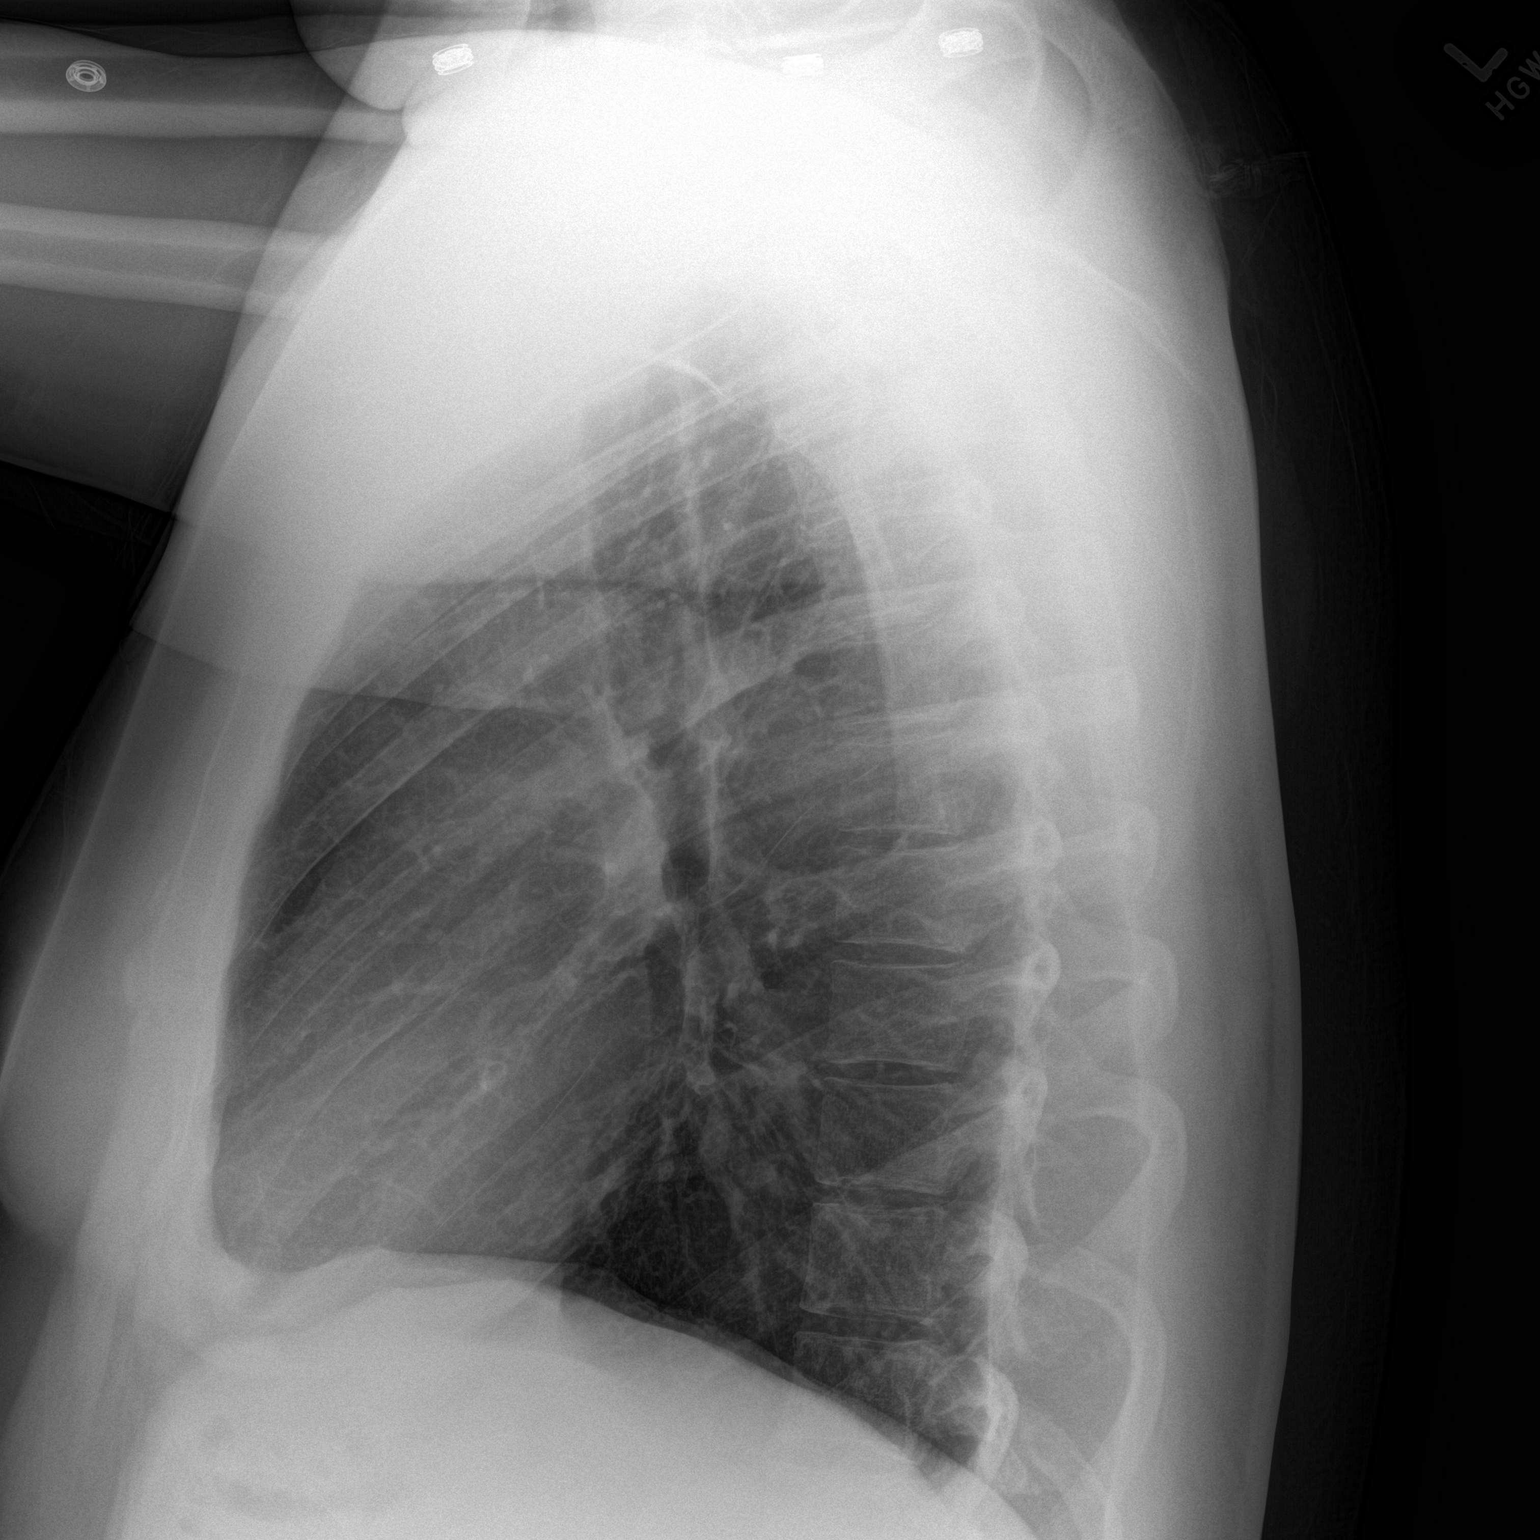

[2 of 2 positions shown; findings below may reference images not displayed]

FINDINGS: The heart size and mediastinal contours are within normal limits.
Both lungs are clear. The visualized skeletal structures are
unremarkable.
IMPRESSION: No active cardiopulmonary disease.

## 2017-02-12 ENCOUNTER — Emergency Department (HOSPITAL_COMMUNITY)
Admission: EM | Admit: 2017-02-12 | Discharge: 2017-02-12 | Disposition: A | Discharge status: Home / Self Care | Payer: Self-pay | Attending: Emergency Medicine | Admitting: Emergency Medicine

## 2017-02-12 ENCOUNTER — Encounter (HOSPITAL_COMMUNITY): Payer: Self-pay | Admitting: Emergency Medicine

## 2017-02-12 DIAGNOSIS — Y999 Unspecified external cause status: Secondary | ICD-10-CM | POA: Insufficient documentation

## 2017-02-12 DIAGNOSIS — Z79899 Other long term (current) drug therapy: Secondary | ICD-10-CM | POA: Insufficient documentation

## 2017-02-12 DIAGNOSIS — I10 Essential (primary) hypertension: Secondary | ICD-10-CM | POA: Insufficient documentation

## 2017-02-12 DIAGNOSIS — X58XXXA Exposure to other specified factors, initial encounter: Secondary | ICD-10-CM | POA: Insufficient documentation

## 2017-02-12 DIAGNOSIS — S39012A Strain of muscle, fascia and tendon of lower back, initial encounter: Secondary | ICD-10-CM | POA: Insufficient documentation

## 2017-02-12 DIAGNOSIS — Y929 Unspecified place or not applicable: Secondary | ICD-10-CM | POA: Insufficient documentation

## 2017-02-12 DIAGNOSIS — Y9389 Activity, other specified: Secondary | ICD-10-CM | POA: Insufficient documentation

## 2017-02-12 MED ORDER — CYCLOBENZAPRINE HCL 10 MG PO TABS: 10.0000 mg | ORAL_TABLET | Freq: Two times a day (BID) | ORAL | 0 refills | Status: DC | PRN

## 2017-02-12 MED ORDER — IBUPROFEN 800 MG PO TABS: 800.0000 mg | ORAL_TABLET | Freq: Three times a day (TID) | ORAL | 0 refills | Status: AC

## 2017-02-12 NOTE — ED Triage Notes (Signed)
Pt sts lower back pain x 2 weeks worse on left side with radiation down left leg

## 2017-02-12 NOTE — ED Provider Notes (Signed)
[facility] DEPT Provider Note   CSN: [id number] Arrival date & time: 02/12/17  1001   By signing my name below, I, Hilbert Odor, attest that this documentation has been prepared under the direction and in the presence of Glendell Docker, NP. Electronically Signed: Hilbert Odor, Scribe. 02/12/17. 10:57 AM. History   Chief Complaint Chief Complaint  Patient presents with  . Back Pain    The history is provided by the patient. No language interpreter was used.  HPI Comments: Aaron Jacobs is a 46 y.o. male who presents to the Emergency Department complaining of lower back pain for the past couple of weeks. He states states that he was at work at that time and before noon that day he began to experience this pain. He states that he typically can work throughout the entire day and only experience some tightness in his back. This has never happened in the past. He reports taking Ibuprofen (800 mg) daily with no significant relief. He denies weakness in legs but he reports some tightness. He also denies numbness in legs, any urinary symptoms, and any recent injuries.  Past Medical History:  Diagnosis Date  . Hypertension   . Sarcoidosis [facility])     Patient Active Problem List   Diagnosis Date Noted  . SARCOIDOSIS 04/20/2010  . HYPERTENSION 04/20/2010    History reviewed. No pertinent surgical history.     Home Medications    Prior to Admission medications   Medication Sig Start Date End Date Taking? Authorizing Provider  hydrochlorothiazide (HYDRODIURIL) 12.5 MG tablet Take 12.5 mg by mouth daily.     Yes Historical Provider, MD  ibuprofen (ADVIL,MOTRIN) 200 MG tablet Take 200-800 mg by mouth every 6 (six) hours as needed for mild pain.   Yes Historical Provider, MD  ibuprofen (ADVIL,MOTRIN) 800 MG tablet Take 1 tablet (800 mg total) by mouth every 8 (eight) hours as needed. Patient not taking: Reported on 02/12/2017 09/17/14   Domenic Moras, PA-C  traMADol (ULTRAM) 50 MG  tablet Take 1 tablet (50 mg total) by mouth every 6 (six) hours as needed. Patient not taking: Reported on 02/12/2017 04/03/15   Orpah Greek, MD    Family History History reviewed. No pertinent family history.  Social History Social History  Substance Use Topics  . Smoking status: Never Smoker  . Smokeless tobacco: Never Used  . Alcohol use Yes     Comment: occassionally     Allergies   Patient has no known allergies.   Review of Systems Review of Systems  Genitourinary: Negative for difficulty urinating, dysuria and frequency.  Musculoskeletal: Positive for back pain (Lower). Negative for neck pain.  Neurological: Negative for weakness and numbness.  All other systems reviewed and are negative.   Physical Exam Updated Vital Signs BP 130/97 (BP Location: Left Arm)   Pulse 89   Temp 98.2 F (36.8 C) (Oral)   Resp 18   Ht 6\' 4"  (1.93 m)   Wt (!) 305 lb (138.3 kg)   SpO2 97%   BMI 37.13 kg/m   Physical Exam  Constitutional: He is oriented to person, place, and time. He appears well-developed and well-nourished.  HENT:  Head: Normocephalic.  Eyes: EOM are normal.  Neck: Normal range of motion.  Pulmonary/Chest: Effort normal.  Abdominal: He exhibits no distension.  Musculoskeletal: Normal range of motion.  Lumbar paraspinal tenderness. Able to do bilateral straight leg raises. Pulses intact. Good strength and sensation to bilateral extremities  Neurological: He is alert and  oriented to person, place, and time.  Psychiatric: He has a normal mood and affect.  Nursing note and vitals reviewed.    ED Treatments / Results  DIAGNOSTIC STUDIES: Oxygen Saturation is 97% on RA, normal by my interpretation.    COORDINATION OF CARE: 10:47 AM Discussed treatment plan with pt at bedside and pt agreed to plan. I discussed different medications that he could try to alleviate some of his pain.  Labs (all labs ordered are listed, but only abnormal results are  displayed) Labs Reviewed - No data to display  EKG  EKG Interpretation None       Radiology No results found.  Procedures Procedures (including critical care time)  Medications Ordered in ED Medications - No data to display   Initial Impression / Assessment and Plan / ED Course  I have reviewed the triage vital signs and the nursing notes.  Pertinent labs & imaging results that were available during my care of the patient were reviewed by me and considered in my medical decision making (see chart for details).     No red flag symptoms. Will treat with flexeril and ibuprofen. Discussed supportive care with pt  Final Clinical Impressions(s) / ED Diagnoses   Final diagnoses:  None    New Prescriptions New Prescriptions   No medications on file   I personally performed the services described in this documentation, which was scribed in my presence. The recorded information has been reviewed and is accurate.    Glendell Docker, NP 02/12/17 [place], MD 02/12/17 [phone]

## 2019-04-08 ENCOUNTER — Emergency Department (HOSPITAL_COMMUNITY)
Admission: EM | Admit: 2019-04-08 | Discharge: 2019-04-08 | Disposition: A | Discharge status: Home / Self Care | Payer: Self-pay | Attending: Emergency Medicine | Admitting: Emergency Medicine

## 2019-04-08 ENCOUNTER — Other Ambulatory Visit: Payer: Self-pay

## 2019-04-08 ENCOUNTER — Encounter (HOSPITAL_COMMUNITY): Payer: Self-pay | Admitting: Emergency Medicine

## 2019-04-08 DIAGNOSIS — D869 Sarcoidosis, unspecified: Secondary | ICD-10-CM | POA: Insufficient documentation

## 2019-04-08 DIAGNOSIS — I10 Essential (primary) hypertension: Secondary | ICD-10-CM | POA: Insufficient documentation

## 2019-04-08 DIAGNOSIS — R809 Proteinuria, unspecified: Secondary | ICD-10-CM | POA: Insufficient documentation

## 2019-04-08 DIAGNOSIS — N4889 Other specified disorders of penis: Secondary | ICD-10-CM | POA: Insufficient documentation

## 2019-04-08 LAB — URINALYSIS, ROUTINE W REFLEX MICROSCOPIC
Bilirubin Urine: NEGATIVE
Glucose, UA: NEGATIVE mg/dL
Hgb urine dipstick: NEGATIVE
Ketones, ur: NEGATIVE mg/dL
Leukocytes,Ua: NEGATIVE
Nitrite: NEGATIVE
Protein, ur: 100 mg/dL — AB
Specific Gravity, Urine: 1.025 (ref 1.005–1.030)
pH: 6 (ref 5.0–8.0)

## 2019-04-08 NOTE — Discharge Instructions (Addendum)
As we discussed your urine today was reassuring. If you continue to have blood in your urine, you may need up follow up with the referred urologist.   Please have your urine rechecked by your primary care doctor next week.   Return to the Emergency Department for any worsening pain, blood in sperm, testicular pain and swelling or any toher worsening or concerning symptoms.

## 2019-04-08 NOTE — ED Provider Notes (Signed)
[facility] EMERGENCY DEPARTMENT Provider Note   CSN: [id number] Arrival date & time: 04/08/19  1632    History   Chief Complaint Chief Complaint  Patient presents with  . Hematuria    HPI Aaron Jacobs is a 48 y.o. male with PMH/o HTN, sarcoidosis who presents for evaluation of hematuria and penile pain that began yesterday.  Patient reports that he had an episode last night where he had some dysuria and pain at the tip of his penis.  He states that he had another episode about 2 hours later and at that point noted some blood at the tip of his penis.  He did not notice any blood in the urine.  Patient states that he has not had any further episodes of hematuria or dysuria but states that he was concerned and felt he needed to come into the emergency department.  Patient states he occasionally had some right-sided pain but states it did not radiate to his abdomen.  He is not having nausea/vomiting, fevers.  She states he had a history of gonorrhea as a teenager but states he is not had any STD since.  He is currently sexually active with one partner.  Patient denies any chest pain, difficulty breathing, no discharge, testicular edema or erythema, night sweats, fevers, unintentional weight loss. He is not a current smoker.      The history is provided by the patient.    Past Medical History:  Diagnosis Date  . Hypertension   . Sarcoidosis     Patient Active Problem List   Diagnosis Date Noted  . SARCOIDOSIS 04/20/2010  . HYPERTENSION 04/20/2010    History reviewed. No pertinent surgical history.      Home Medications    Prior to Admission medications   Medication Sig Start Date End Date Taking? Authorizing Provider  hydrochlorothiazide (MICROZIDE) 12.5 MG capsule Take 12.5 mg by mouth daily.    Yes [provider]  ibuprofen (ADVIL) 200 MG tablet Take 200 mg by mouth every 6 (six) hours as needed for moderate pain.   Yes [provider]  cyclobenzaprine (FLEXERIL) 10 MG tablet Take 1 tablet (10 mg total) by mouth 2 (two) times daily as needed for muscle spasms. Patient not taking: Reported on 04/08/2019 02/12/17   Glendell Docker, NP  ibuprofen (ADVIL,MOTRIN) 800 MG tablet Take 1 tablet (800 mg total) by mouth 3 (three) times daily. Patient not taking: Reported on 04/08/2019 02/12/17   Glendell Docker, NP    Family History History reviewed. No pertinent family history.  Social History Social History   Tobacco Use  . Smoking status: Never Smoker  . Smokeless tobacco: Never Used  Substance Use Topics  . Alcohol use: Yes    Comment: occassionally  . Drug use: No     Allergies   Patient has no known allergies.   Review of Systems Review of Systems  Constitutional: Negative for fever.  Respiratory: Negative for cough and shortness of breath.   Cardiovascular: Negative for chest pain.  Gastrointestinal: Negative for abdominal pain, nausea and vomiting.  Genitourinary: Positive for hematuria and penile pain. Negative for discharge, dysuria, penile swelling and scrotal swelling.  Neurological: Negative for headaches.  All other systems reviewed and are negative.    Physical Exam Updated Vital Signs BP (!) 135/96 (BP Location: Right Arm)   Pulse 99   Temp 99.2 F (37.3 C) (Oral)   Resp 18   Ht 6\' 3"  (1.905 m)   Wt Marland Kitchen)  145.2 kg   SpO2 98%   BMI 40.00 kg/m   Physical Exam Vitals signs and nursing note reviewed. Exam conducted with a chaperone present.  Constitutional:      Appearance: Normal appearance. He is well-developed.  HENT:     Head: Normocephalic and atraumatic.  Eyes:     General: Lids are normal.     Conjunctiva/sclera: Conjunctivae normal.     Pupils: Pupils are equal, round, and reactive to light.  Neck:     Musculoskeletal: Full passive range of motion without pain.  Cardiovascular:     Rate and Rhythm: Normal rate and regular rhythm.     Pulses: Normal pulses.     Heart sounds:  Normal heart sounds. No murmur. No friction rub. No gallop.   Pulmonary:     Effort: Pulmonary effort is normal.     Breath sounds: Normal breath sounds.     Comments: Lungs clear to auscultation bilaterally.  Symmetric chest rise.  No wheezing, rales, rhonchi. Abdominal:     Palpations: Abdomen is soft. Abdomen is not rigid.     Tenderness: There is no abdominal tenderness. There is no right CVA tenderness, left CVA tenderness or guarding.     Hernia: There is no hernia in the right inguinal area or left inguinal area.     Comments: Abdomen is soft, non-distended, non-tender. No rigidity, No guarding. No peritoneal signs.  No CVA tenderness bilaterally.  Genitourinary:    Penis: Uncircumcised.      Scrotum/Testes: Normal.        Right: Tenderness or swelling not present.        Left: Tenderness or swelling not present.     Comments: The exam was performed with a chaperone present. Normal male genitalia. No evidence of rash, ulcers or lesions.  Bilateral testicles are symmetric in appearance without any overlying warmth, erythema, edema.  No tenderness noted bilaterally. Musculoskeletal: Normal range of motion.  Skin:    General: Skin is warm and dry.     Capillary Refill: Capillary refill takes less than 2 seconds.  Neurological:     Mental Status: He is alert and oriented to person, place, and time.  Psychiatric:        Speech: Speech normal.      ED Treatments / Results  Labs (all labs ordered are listed, but only abnormal results are displayed) Labs Reviewed  URINALYSIS, ROUTINE W REFLEX MICROSCOPIC - Abnormal; Notable for the following components:      Result Value   APPearance HAZY (*)    Protein, ur 100 (*)    Bacteria, UA RARE (*)    All other components within normal limits    EKG None  Radiology No results found.  Procedures Procedures (including critical care time)  Medications Ordered in ED Medications - No data to display   Initial Impression /  Assessment and Plan / ED Course  I have reviewed the triage vital signs and the nursing notes.  Pertinent labs & imaging results that were available during my care of the patient were reviewed by me and considered in my medical decision making (see chart for details).        48 y.o. M presents for evaluation of episode of hematuria penile pain last night.  Reports seeing blood at the tip of his penis.  Has not had any other other episodes today.  He also reports an episode of discomfort at the tip of the penis with urination.  No symptoms today.  He reports having a slight right-sided pain but states that has resolved.  No vomiting, nausea, fevers.  Doubt kidney stone.  He has no abdominal tenderness on exam.  We will plan to check urine.  UA shows no evidence of hemoglobin, he has more amount of protein but otherwise unremarkable. No evidence of infectious process.  I discussed with patient regarding testing for gonorrhea/committee.  Patient states he is not concerned about STDs and does not want to be tested at this time.  I discussed with patient that at this time, his exam is not suspicious for kidney stone.  I did discuss with patient regarding having his urine rechecked in about a week by his primary care doctor.  Additionally, we will give him outpatient urology referral. Discussed patient with Dr. Sedonia Small who is agreeable. At this time, patient exhibits no emergent life-threatening condition that require further evaluation in ED or admission. Patient had ample opportunity for questions and discussion. All patient's questions were answered with full understanding. Strict return precautions discussed. Patient expresses understanding and agreement to plan.   Portions of this note were generated with [employer]. Dictation errors may occur despite best attempts at proofreading.  Final Clinical Impressions(s) / ED Diagnoses   Final diagnoses:  Proteinuria, unspecified type    ED  Discharge Orders    None       Desma Mcgregor 04/08/19 2309    Maudie Flakes, MD 04/13/19 [phone]

## 2019-04-08 NOTE — ED Triage Notes (Signed)
Pt reports blood in urine yesterday and pain on the tip of his penis during urination. Reports feeling discomfort all night.

## 2021-07-17 ENCOUNTER — Ambulatory Visit
Admission: EM | Admit: 2021-07-17 | Discharge: 2021-07-17 | Disposition: A | Discharge status: Home / Self Care | Payer: 59 | Attending: Urgent Care | Admitting: Urgent Care

## 2021-07-17 DIAGNOSIS — R300 Dysuria: Secondary | ICD-10-CM

## 2021-07-17 DIAGNOSIS — R319 Hematuria, unspecified: Secondary | ICD-10-CM

## 2021-07-17 LAB — POCT URINALYSIS DIP (MANUAL ENTRY)
Bilirubin, UA: NEGATIVE
Blood, UA: NEGATIVE
Glucose, UA: NEGATIVE mg/dL
Ketones, POC UA: NEGATIVE mg/dL
Leukocytes, UA: NEGATIVE
Nitrite, UA: NEGATIVE
Protein Ur, POC: NEGATIVE mg/dL
Spec Grav, UA: 1.025 (ref 1.010–1.025)
Urobilinogen, UA: 0.2 E.U./dL
pH, UA: 7 (ref 5.0–8.0)

## 2021-07-17 NOTE — Discharge Instructions (Addendum)

## 2021-07-17 NOTE — ED Provider Notes (Signed)
[facility]   MRN: [id number] DOB: [birth year]  Subjective:   Aaron Jacobs is a 50 y.o. male presenting for 1 day history of dysuria and hematuria.  Patient states that he had 2 episodes of this yesterday.  Today he did not have any more symptoms but wanted to make sure that he got checked.  Denies fever, nausea, vomiting, abdominal or pelvic pain, ongoing dysuria, changes to the urinary stream.  Denies history of prostate issues but does have an appointment coming up with his regular doctor for recheck on his prostate.  Denies history of renal stones.  Patient hydrates very well, limits his urinary irritants.  No current facility-administered medications for this encounter.  Current Outpatient Medications:    cyclobenzaprine (FLEXERIL) 10 MG tablet, Take 1 tablet (10 mg total) by mouth 2 (two) times daily as needed for muscle spasms. (Patient not taking: Reported on 04/08/2019), Disp: 20 tablet, Rfl: 0   hydrochlorothiazide (MICROZIDE) 12.5 MG capsule, Take 12.5 mg by mouth daily. , Disp: , Rfl:    ibuprofen (ADVIL) 200 MG tablet, Take 200 mg by mouth every 6 (six) hours as needed for moderate pain., Disp: , Rfl:    ibuprofen (ADVIL,MOTRIN) 800 MG tablet, Take 1 tablet (800 mg total) by mouth 3 (three) times daily. (Patient not taking: Reported on 04/08/2019), Disp: 21 tablet, Rfl: 0   No Known Allergies  Past Medical History:  Diagnosis Date   Hypertension    Sarcoidosis      History reviewed. No pertinent surgical history.  History reviewed. No pertinent family history.  Social History   Tobacco Use   Smoking status: Never   Smokeless tobacco: Never  Substance Use Topics   Alcohol use: Yes    Comment: occassionally   Drug use: No    ROS   Objective:   Vitals: BP 134/90 (BP Location: Left Arm)   Pulse 86   Temp 98.3 F (36.8 C) (Oral)   Resp 16   SpO2 95%   Physical Exam Constitutional:      General: He is not in acute distress.    Appearance:  Normal appearance. He is well-developed and normal weight. He is not ill-appearing, toxic-appearing or diaphoretic.  HENT:     Head: Normocephalic and atraumatic.     Right Ear: External ear normal.     Left Ear: External ear normal.     Nose: Nose normal.     Mouth/Throat:     Pharynx: Oropharynx is clear.  Eyes:     General: No scleral icterus.       Right eye: No discharge.        Left eye: No discharge.     Extraocular Movements: Extraocular movements intact.     Pupils: Pupils are equal, round, and reactive to light.  Cardiovascular:     Rate and Rhythm: Normal rate.  Pulmonary:     Effort: Pulmonary effort is normal.  Abdominal:     General: Bowel sounds are normal. There is no distension.     Palpations: Abdomen is soft. There is no mass.     Tenderness: no abdominal tenderness There is no right CVA tenderness, left CVA tenderness, guarding or rebound.  Musculoskeletal:     Cervical back: Normal range of motion.  Skin:    General: Skin is warm and dry.  Neurological:     Mental Status: He is alert and oriented to person, place, and time.  Psychiatric:        Mood  and Affect: Mood normal.        Behavior: Behavior normal.        Thought Content: Thought content normal.        Judgment: Judgment normal.    Results for orders placed or performed during the hospital encounter of 07/17/21 (from the past 24 hour(s))  POCT urinalysis dipstick     Status: None   Collection Time: 07/17/21  2:09 PM  Result Value Ref Range   Color, UA yellow yellow   Clarity, UA clear clear   Glucose, UA negative negative mg/dL   Bilirubin, UA negative negative   Ketones, POC UA negative negative mg/dL   Spec Grav, UA 1.025 1.010 - 1.025   Blood, UA negative negative   pH, UA 7.0 5.0 - 8.0   Protein Ur, POC negative negative mg/dL   Urobilinogen, UA 0.2 0.2 or 1.0 E.U./dL   Nitrite, UA Negative Negative   Leukocytes, UA Negative Negative    Assessment and Plan :   PDMP not reviewed  this encounter.  1. Dysuria   2. Hematuria, unspecified type     Urinalysis is completely normal and patient is asymptomatic today.  He does admit that yesterday he was holding his urine a lot.  Counseled that this may be a source of his symptoms.  However, will order urine culture.  I reassured the patient is no longer having symptoms and has hemodynamically stable vital signs, unremarkable physical exam.  Recommend close follow-up with PCP otherwise. Counseled patient on potential for adverse effects with medications prescribed/recommended today, ER and return-to-clinic precautions discussed, patient verbalized understanding.    Jaynee Eagles, PA-C 07/17/21 1416

## 2021-07-17 NOTE — ED Triage Notes (Signed)
Patient presents to Urgent Care with complaints of dysuria and hematuria yesterday.   Denies fever, abdominal pain.

## 2021-07-18 LAB — URINE CULTURE: Culture: NO GROWTH

## 2021-11-17 ENCOUNTER — Ambulatory Visit (AMBULATORY_SURGERY_CENTER): Payer: 59 | Admitting: *Deleted

## 2021-11-17 ENCOUNTER — Other Ambulatory Visit: Payer: Self-pay

## 2021-11-17 VITALS — Ht 76.0 in | Wt 310.0 lb

## 2021-11-17 DIAGNOSIS — Z1211 Encounter for screening for malignant neoplasm of colon: Secondary | ICD-10-CM

## 2021-11-17 MED ORDER — NA SULFATE-K SULFATE-MG SULF 17.5-3.13-1.6 GM/177ML PO SOLN: 1.0000 | Freq: Once | ORAL | 0 refills | Status: AC

## 2021-11-17 NOTE — Progress Notes (Signed)
Virtual pre visit completed over telephone. Instructions forwarded through secure e-mail [email or web address]     No egg or soy allergy known to patient  No issues known to pt with past sedation with any surgeries or procedures Patient denies ever being told they had issues or difficulty with intubation  No FH of Malignant Hyperthermia Pt is not on diet pills Pt is not on  home 02  Pt is not on blood thinners  Pt denies issues with constipation  No A fib or A flutter  Pt is fully vaccinated  for Covid    Due to the COVID-19 pandemic we are asking patients to follow certain guidelines in [place] and the [place]   Pt aware of COVID protocols and [facility] guidelines

## 2021-12-01 ENCOUNTER — Other Ambulatory Visit: Payer: Self-pay

## 2021-12-01 ENCOUNTER — Encounter: Payer: Self-pay | Admitting: Internal Medicine

## 2021-12-01 ENCOUNTER — Ambulatory Visit (AMBULATORY_SURGERY_CENTER): Payer: 59 | Admitting: Internal Medicine

## 2021-12-01 VITALS — BP 127/82 | HR 83 | Temp 98.7°F | Resp 12 | Ht 76.0 in | Wt 310.0 lb

## 2021-12-01 DIAGNOSIS — D123 Benign neoplasm of transverse colon: Secondary | ICD-10-CM

## 2021-12-01 DIAGNOSIS — Z1211 Encounter for screening for malignant neoplasm of colon: Secondary | ICD-10-CM | POA: Diagnosis present

## 2021-12-01 MED ORDER — SODIUM CHLORIDE 0.9 % IV SOLN: 500.0000 mL | Freq: Once | INTRAVENOUS | Status: DC

## 2021-12-01 NOTE — Op Note (Signed)
[name] Procedure Date: 12/01/2021 8:37 AM MRN: [id number] Endoscopist: Gatha Mayer , MD Age: 50 Referring MD:  Date of Birth: 18-Oct-1971 Gender: Male Account #: [id number] Procedure:                Colonoscopy Indications:              Screening for colorectal malignant neoplasm, This                            is the patient's first colonoscopy Medicines:                Propofol per Anesthesia, Monitored Anesthesia Care Procedure:                Pre-Anesthesia Assessment:                           - Prior to the procedure, a History and Physical                            was performed, and patient medications and                            allergies were reviewed. The patient's tolerance of                            previous anesthesia was also reviewed. The risks                            and benefits of the procedure and the sedation                            options and risks were discussed with the patient.                            All questions were answered, and informed consent                            was obtained. Prior Anticoagulants: The patient has                            taken no previous anticoagulant or antiplatelet                            agents. ASA Grade Assessment: III - A patient with                            severe systemic disease. After reviewing the risks                            and benefits, the patient was deemed in                            satisfactory condition to undergo the procedure.  After obtaining informed consent, the colonoscope                            was passed under direct vision. Throughout the                            procedure, the patient's blood pressure, pulse, and                            oxygen saturations were monitored continuously. The                            Olympus CF-HQ190L [id number]) Colonoscope was                             introduced through the anus and advanced to the the                            cecum, identified by appendiceal orifice and                            ileocecal valve. The colonoscopy was performed                            without difficulty. The patient tolerated the                            procedure well. The quality of the bowel                            preparation was good. The ileocecal valve,                            appendiceal orifice, and rectum were photographed. Scope In: 8:56:47 AM Scope Out: 9:10:20 AM Scope Withdrawal Time: 0 hours 11 minutes 14 seconds  Total Procedure Duration: 0 hours 13 minutes 33 seconds  Findings:                 The perianal and digital rectal examinations were                            normal. Pertinent negatives include normal prostate                            (size, shape, and consistency).                           A 2 mm polyp was found in the transverse colon. The                            polyp was sessile. The polyp was removed with a                            cold snare. Resection and retrieval were complete.  Verification of patient identification for the                            specimen was done. Estimated blood loss was minimal.                           Multiple diverticula were found in the sigmoid                            colon.                           The exam was otherwise without abnormality on                            direct and retroflexion views. Complications:            No immediate complications. Estimated Blood Loss:     Estimated blood loss was minimal. Impression:               - One 2 mm polyp in the transverse colon, removed                            with a cold snare. Resected and retrieved.                           - Diverticulosis in the sigmoid colon.                           - The examination was otherwise normal on direct                            and retroflexion  views. Recommendation:           - Patient has a contact number available for                            emergencies. The signs and symptoms of potential                            delayed complications were discussed with the                            patient. Return to normal activities tomorrow.                            Written discharge instructions were provided to the                            patient.                           - Resume previous diet.                           - Continue present medications.                           -  Repeat colonoscopy is recommended. The                            colonoscopy date will be determined after pathology                            results from today's exam become available for                            review. Gatha Mayer, MD 12/01/2021 9:14:15 AM This report has been signed electronically.

## 2021-12-01 NOTE — Progress Notes (Signed)
To PACU, VSS. Report to [email or web address].tb 

## 2021-12-01 NOTE — Progress Notes (Signed)
[facility] History and Physical   Primary Care Physician:  Tisovec, Fransico Him, MD   Reason for Procedure:   Colon cancer screening  Plan:    colonoscopy     HPI: Aaron Jacobs is a 50 y.o. male here for screening colonoscopy   Past Medical History:  Diagnosis Date   Allergy    Hypertension    Sarcoidosis     History reviewed. No pertinent surgical history.  Prior to Admission medications   Medication Sig Start Date End Date Taking? Authorizing Provider  fluticasone (FLONASE) 50 MCG/ACT nasal spray Place 2 sprays into both nostrils daily. 06/09/21  Yes [provider]  hydrochlorothiazide (MICROZIDE) 12.5 MG capsule Take 12.5 mg by mouth daily.    Yes [provider]  cyclobenzaprine (FLEXERIL) 10 MG tablet Take 1 tablet (10 mg total) by mouth 2 (two) times daily as needed for muscle spasms. Patient not taking: Reported on 12/01/2021 02/12/17   Glendell Docker, NP  ibuprofen (ADVIL,MOTRIN) 800 MG tablet Take 1 tablet (800 mg total) by mouth 3 (three) times daily. Patient not taking: Reported on 12/01/2021 02/12/17   Glendell Docker, NP    Current Outpatient Medications  Medication Sig Dispense Refill   fluticasone (FLONASE) 50 MCG/ACT nasal spray Place 2 sprays into both nostrils daily.     hydrochlorothiazide (MICROZIDE) 12.5 MG capsule Take 12.5 mg by mouth daily.      cyclobenzaprine (FLEXERIL) 10 MG tablet Take 1 tablet (10 mg total) by mouth 2 (two) times daily as needed for muscle spasms. (Patient not taking: Reported on 12/01/2021) 20 tablet 0   ibuprofen (ADVIL,MOTRIN) 800 MG tablet Take 1 tablet (800 mg total) by mouth 3 (three) times daily. (Patient not taking: Reported on 12/01/2021) 21 tablet 0   Current Facility-Administered Medications  Medication Dose Route Frequency Provider Last Rate Last Admin   0.9 %  sodium chloride infusion  500 mL Intravenous Once Gatha Mayer, MD        Allergies as of 12/01/2021   (No Known  Allergies)    Family History  Problem Relation Age of Onset   Breast cancer Sister    Colon cancer Neg Hx    Colon polyps Neg Hx     Social History   Socioeconomic History   Marital status: Married    Spouse name: Not on file   Number of children: Not on file   Years of education: Not on file   Highest education level: Not on file  Occupational History   Not on file  Tobacco Use   Smoking status: Never   Smokeless tobacco: Never  Substance and Sexual Activity   Alcohol use: Yes    Comment: occassionally   Drug use: No   Sexual activity: Yes  stems:  All other review of systems negative except as mentioned in the HPI.  Physical Exam: Vital signs BP (!) 158/92    Pulse 92    Temp 98.7 F (37.1 C) (Skin)    Resp 14    Ht 6\' 4"  (1.93 m)    Wt (!) 310 lb (140.6 kg)    SpO2 98%    BMI 37.73 kg/m   General:   Alert,  Well-developed, well-nourished, pleasant and cooperative in NAD Lungs:  Clear throughout to auscultation.   Heart:  Regular rate and rhythm; no murmurs, clicks, rubs,  or gallops. Abdomen:  Soft, nontender and nondistended. Normal bowel sounds.   Neuro/Psych:  Alert and cooperative. Normal mood and affect. A  and O x 3   @Raeghan Demeter  Simonne Maffucci, MD, [facility] [phone] (pager) 12/01/2021 8:50 AM@

## 2021-12-01 NOTE — Patient Instructions (Addendum)
I found and removed one very tiny polyp that looks benign. I will let you know pathology results and when to have another routine colonoscopy by mail and/or My Chart.  You also have a condition called diverticulosis - common and not usually a problem. Please read the handout provided.  I appreciate the opportunity to care for you. Gatha Mayer, MD, [facility]  Handouts provided on polyps and diverticulosis.   YOU HAD AN ENDOSCOPIC PROCEDURE TODAY AT [facility]:   Refer to the procedure report that was given to you for any specific questions about what was found during the examination.  If the procedure report does not answer your questions, please call your gastroenterologist to clarify.  If you requested that your care partner not be given the details of your procedure findings, then the procedure report has been included in a sealed envelope for you to review at your convenience later.  YOU SHOULD EXPECT: Some feelings of bloating in the abdomen. Passage of more gas than usual.  Walking can help get rid of the air that was put into your GI tract during the procedure and reduce the bloating. If you had a lower endoscopy (such as a colonoscopy or flexible sigmoidoscopy) you may notice spotting of blood in your stool or on the toilet paper. If you underwent a bowel prep for your procedure, you may not have a normal bowel movement for a few days.  Please Note:  You might notice some irritation and congestion in your nose or some drainage.  This is from the oxygen used during your procedure.  There is no need for concern and it should clear up in a day or so.  SYMPTOMS TO REPORT IMMEDIATELY:  Following lower endoscopy (colonoscopy or flexible sigmoidoscopy):  Excessive amounts of blood in the stool  Significant tenderness or worsening of abdominal pains  Swelling of the abdomen that is new, acute  Fever of 100F or higher  For urgent or emergent issues, a gastroenterologist can be  reached at any hour by calling [phone]. Do not use MyChart messaging for urgent concerns.    DIET:  We do recommend a small meal at first, but then you may proceed to your regular diet.  Drink plenty of fluids but you should avoid alcoholic beverages for 24 hours.  ACTIVITY:  You should plan to take it easy for the rest of today and you should NOT DRIVE or use heavy machinery until tomorrow (because of the sedation medicines used during the test).    FOLLOW UP: Our staff will call the number listed on your records 48-72 hours following your procedure to check on you and address any questions or concerns that you may have regarding the information given to you following your procedure. If we do not reach you, we will leave a message.  We will attempt to reach you two times.  During this call, we will ask if you have developed any symptoms of COVID 19. If you develop any symptoms (ie: fever, flu-like symptoms, shortness of breath, cough etc.) before then, please call [phone].  If you test positive for Covid 19 in the 2 weeks post procedure, please call and report this information to [place].    If any biopsies were taken you will be contacted by phone or by letter within the next 1-3 weeks.  Please call us at [phone] if you have not heard about the biopsies in 3 weeks.    SIGNATURES/CONFIDENTIALITY: You and/or your  care partner have signed paperwork which will be entered into your electronic medical record.  These signatures attest to the fact that that the information above on your After Visit Summary has been reviewed and is understood.  Full responsibility of the confidentiality of this discharge information lies with you and/or your care-partner.

## 2021-12-01 NOTE — Progress Notes (Signed)
Called to room to assist during endoscopic procedure.  Patient ID and intended procedure confirmed with present staff. Received instructions for my participation in the procedure from the performing physician.  

## 2021-12-01 NOTE — Progress Notes (Signed)
Pt's states no medical or surgical changes since previsit or office visit. VS assessed by C.W 

## 2021-12-06 ENCOUNTER — Telehealth: Payer: Self-pay

## 2021-12-06 NOTE — Telephone Encounter (Signed)
°  Follow up Call-  Call back number 12/01/2021  Post procedure Call Back phone  # [phone]  Permission to leave phone message Yes  Some recent data might be hidden    Follow up call made.  NALM

## 2021-12-12 ENCOUNTER — Encounter: Payer: Self-pay | Admitting: Internal Medicine

## 2021-12-12 DIAGNOSIS — Z860101 Personal history of adenomatous and serrated colon polyps: Secondary | ICD-10-CM

## 2021-12-12 DIAGNOSIS — Z86010 Personal history of colonic polyps: Secondary | ICD-10-CM | POA: Insufficient documentation

## 2021-12-12 HISTORY — DX: Personal history of adenomatous and serrated colon polyps: Z86.0101

## 2021-12-12 HISTORY — DX: Personal history of colonic polyps: Z86.010

## 2023-05-18 ENCOUNTER — Ambulatory Visit
Admission: EM | Admit: 2023-05-18 | Discharge: 2023-05-18 | Disposition: A | Discharge status: Home / Self Care | Payer: Self-pay | Attending: Urgent Care | Admitting: Urgent Care

## 2023-05-18 DIAGNOSIS — I10 Essential (primary) hypertension: Secondary | ICD-10-CM

## 2023-05-18 MED ORDER — HYDROCHLOROTHIAZIDE 12.5 MG PO TABS: 12.5000 mg | ORAL_TABLET | Freq: Every day | ORAL | 0 refills | Status: DC

## 2023-05-18 NOTE — ED Triage Notes (Signed)
Pt requested hydrochlorothiazide refill. Pt is looking for a new PCP.

## 2023-05-18 NOTE — Discharge Instructions (Addendum)
For diabetes or elevated blood sugar, please make sure you are limiting and avoiding starchy, carbohydrate foods like pasta, breads, sweet breads, pastry, rice, potatoes, desserts. These foods can elevate your blood sugar. Also, limit and avoid drinks that contain a lot of sugar such as sodas, sweet teas, fruit juices.  Drinking plain water will be much more helpful, try 64 ounces of water daily.  It is okay to flavor your water naturally by cutting cucumber, lemon, mint or lime, placing it in a picture with water and drinking it over a period of 24-48 hours as long as it remains refrigerated.  For elevated blood pressure, make sure you are monitoring salt in your diet.  Do not eat restaurant foods and limit processed foods at home. I highly recommend you prepare and cook your own foods at home.  Processed foods include things like frozen meals, pre-seasoned meats and dinners, deli meats, canned foods as these foods contain a high amount of sodium/salt.  Make sure you are paying attention to sodium labels on foods you buy at the grocery store. Buy your spices separately such as garlic powder, onion powder, cumin, cayenne, parsley flakes so that you can avoid seasonings that contain salt. However, salt-free seasonings are available and can be used, an example is Mrs. Dash and includes a lot of different mixtures that do not contain salt.  Lastly, when cooking using oils that are healthier for you is important. This includes olive oil, avocado oil, canola oil. We have discussed a lot of foods to avoid but below is a list of foods that can be very healthy to use in your diet whether it is for diabetes, cholesterol, high blood pressure, or in general healthy eating.  Salads - kale, spinach, cabbage, spring mix, arugula Fruits - avocadoes, berries (blueberries, raspberries, blackberries), apples, oranges, pomegranate, grapefruit, kiwi Vegetables - asparagus, cauliflower, broccoli, green beans, brussel sprouts,  bell peppers, beets; stay away from or limit starchy vegetables like potatoes, carrots, peas Other general foods - kidney beans, egg whites, almonds, walnuts, sunflower seeds, pumpkin seeds, fat free yogurt, almond milk, flax seeds, quinoa, oats  Meat - It is better to eat lean meats and limit your red meat including pork to once a week.  Wild caught fish, chicken breast are good options as they tend to be leaner sources of good protein. Still be mindful of the sodium labels for the meats you buy.  DO NOT EAT ANY FOODS ON THIS LIST THAT YOU ARE ALLERGIC TO. For more specific needs, I highly recommend consulting a dietician or nutritionist but this can definitely be a good starting point.  

## 2023-05-18 NOTE — ED Provider Notes (Signed)
[facility]  Note:  This document was prepared using Conservation officer, historic buildings and may include unintentional dictation errors.  MRN: [id number] DOB: 1971/12/07  Subjective:   Aaron Jacobs is a 52 y.o. male presenting for blood pressure medication refill.  Patient was taking hydrochlorothiazide 12.5 mg.  He is out of his medicine.  His next appointment with his PCP is in September.  No headache, confusion, chest pain, nausea, vomiting, abdominal pain, hematuria.  No history of stroke, heart disease, MI.  No smoking of any kind including cigarettes, cigars, vaping, marijuana use.    No current facility-administered medications for this encounter.  Current Outpatient Medications:    cyclobenzaprine (FLEXERIL) 10 MG tablet, Take 1 tablet (10 mg total) by mouth 2 (two) times daily as needed for muscle spasms. (Patient not taking: Reported on 12/01/2021), Disp: 20 tablet, Rfl: 0   fluticasone (FLONASE) 50 MCG/ACT nasal spray, Place 2 sprays into both nostrils daily., Disp: , Rfl:    hydrochlorothiazide (MICROZIDE) 12.5 MG capsule, Take 12.5 mg by mouth daily. , Disp: , Rfl:    ibuprofen (ADVIL,MOTRIN) 800 MG tablet, Take 1 tablet (800 mg total) by mouth 3 (three) times daily. (Patient not taking: Reported on 12/01/2021), Disp: 21 tablet, Rfl: 0   No Known Allergies  Past Medical History:  Diagnosis Date   Allergy    Hx of adenomatous polyp of colon 12/12/2021   Hypertension    Sarcoidosis      History reviewed. No pertinent surgical history.  Family History  Problem Relation Age of Onset   Breast cancer Sister    Colon cancer Neg Hx    Colon polyps Neg Hx     Social History   Tobacco Use   Smoking status: Never   Smokeless tobacco: Never  Substance Use Topics   Alcohol use: Yes    Comment: occassionally   Drug use: No    ROS   Objective:   Vitals: BP (!) 147/91   Pulse 86   Temp 98.5 F (36.9 C) (Oral)   Resp 18   SpO2 95%    Physical Exam Constitutional:      General: He is not in acute distress.    Appearance: Normal appearance. He is well-developed. He is not ill-appearing, toxic-appearing or diaphoretic.  HENT:     Head: Normocephalic and atraumatic.     Right Ear: External ear normal.     Left Ear: External ear normal.     Nose: Nose normal.     Mouth/Throat:     Mouth: Mucous membranes are moist.  Eyes:     General: No scleral icterus.       Right eye: No discharge.        Left eye: No discharge.     Extraocular Movements: Extraocular movements intact.     Pupils: Pupils are equal, round, and reactive to light.  Cardiovascular:     Rate and Rhythm: Normal rate and regular rhythm.     Heart sounds: Normal heart sounds. No murmur heard.    No friction rub. No gallop.  Pulmonary:     Effort: Pulmonary effort is normal. No respiratory distress.     Breath sounds: Normal breath sounds. No stridor. No wheezing, rhonchi or rales.  Neurological:     Mental Status: He is alert and oriented to person, place, and time.     Cranial Nerves: No cranial nerve deficit.     Motor: No weakness.  Coordination: Coordination normal.     Gait: Gait normal.  Psychiatric:        Mood and Affect: Mood normal.        Behavior: Behavior normal.        Thought Content: Thought content normal.     Assessment and Plan :   PDMP not reviewed this encounter.  1. Essential hypertension    Stable, recommend hypertensive friendly diet.  Refilled his hydrochlorothiazide for 90 days. Counseled patient on potential for adverse effects with medications prescribed/recommended today, ER and return-to-clinic precautions discussed, patient verbalized understanding.    Wallis Bamberg, [place] 05/18/23 1220

## 2023-11-07 ENCOUNTER — Ambulatory Visit
Admission: RE | Admit: 2023-11-07 | Discharge: 2023-11-07 | Disposition: A | Discharge status: Home / Self Care | Payer: Self-pay | Source: Ambulatory Visit | Attending: Physician Assistant | Admitting: Physician Assistant

## 2023-11-07 VITALS — BP 144/82 | HR 84 | Temp 98.3°F | Resp 16

## 2023-11-07 DIAGNOSIS — I10 Essential (primary) hypertension: Secondary | ICD-10-CM

## 2023-11-07 MED ORDER — HYDROCHLOROTHIAZIDE 12.5 MG PO TABS: 12.5000 mg | ORAL_TABLET | Freq: Every day | ORAL | 0 refills | Status: DC

## 2023-11-07 NOTE — ED Triage Notes (Signed)
Pt is here to get a refill on hydrochlorothiazide. Does not have a PCP right now but is alright with [place] setting him up with one today. Pt states he ran out of medication a month and half ago.

## 2023-11-07 NOTE — ED Provider Notes (Signed)
[name]    CSN: [id number] Arrival date & time: 11/07/23  1752      History   Chief Complaint Chief Complaint  Patient presents with   Medication Refill    HPI Aaron Jacobs is a 52 y.o. male.   Patient here today for refill blood pressure medication.  He states he has not taken medication in over a week.  He denies any side effects when he was taking hydrochlorothiazide.  He denies any chest pain, shortness of breath or headaches.  The history is provided by the patient.  Medication Refill   Past Medical History:  Diagnosis Date   Allergy    Hx of adenomatous polyp of colon 12/12/2021   Hypertension    Sarcoidosis     Patient Active Problem List   Diagnosis Date Noted   Hx of adenomatous polyp of colon 12/12/2021   SARCOIDOSIS 04/20/2010   Essential hypertension 04/20/2010    History reviewed. No pertinent surgical history.     Home Medications    Prior to Admission medications   Medication Sig Start Date End Date Taking? Authorizing Provider  cyclobenzaprine (FLEXERIL) 10 MG tablet Take 1 tablet (10 mg total) by mouth 2 (two) times daily as needed for muscle spasms. Patient not taking: Reported on 12/01/2021 02/12/17   Teressa Lower, NP  fluticasone [facility]) 50 MCG/ACT nasal spray Place 2 sprays into both nostrils daily. 06/09/21   [provider]  hydrochlorothiazide (HYDRODIURIL) 12.5 MG tablet Take 1 tablet (12.5 mg total) by mouth daily. 11/07/23   Tomi Bamberger, PA-C  ibuprofen (ADVIL,MOTRIN) 800 MG tablet Take 1 tablet (800 mg total) by mouth 3 (three) times daily. Patient not taking: Reported on 12/01/2021 02/12/17   Teressa Lower, NP    Family History Family History  Problem Relation Age of Onset   Breast cancer Sister    Colon cancer Neg Hx    Colon polyps Neg Hx     Social History Social History   Tobacco Use   Smoking status: Never   Smokeless tobacco: Never  Substance Use Topics   Alcohol use: Yes     Comment: occassionally   Drug use: No     Allergies   Patient has no known allergies.   Review of Systems Review of Systems  Constitutional:  Negative for chills and fever.  Eyes:  Negative for discharge and redness.  Respiratory:  Negative for shortness of breath.   Cardiovascular:  Negative for chest pain.  Neurological:  Negative for numbness and headaches.     Physical Exam Triage Vital Signs ED Triage Vitals  Encounter Vitals Group     BP 11/07/23 1804 (!) 144/82     Systolic BP Percentile --      Diastolic BP Percentile --      Pulse Rate 11/07/23 1804 84     Resp 11/07/23 1804 16     Temp 11/07/23 1804 98.3 F (36.8 C)     Temp Source 11/07/23 1804 Oral     SpO2 11/07/23 1804 99 %     Weight --      Height --      Head Circumference --      Peak Flow --      Pain Score 11/07/23 1810 0     Pain Loc --      Pain Education --      Exclude from Growth Chart --    No data found.  Updated Vital Signs BP Marland Kitchen)  144/82 (BP Location: Right Arm)   Pulse 84   Temp 98.3 F (36.8 C) (Oral)   Resp 16   SpO2 99%   Visual Acuity Right Eye Distance:   Left Eye Distance:   Bilateral Distance:    Right Eye Near:   Left Eye Near:    Bilateral Near:     Physical Exam Vitals and nursing note reviewed.  Constitutional:      General: He is not in acute distress.    Appearance: Normal appearance. He is not ill-appearing.  HENT:     Head: Normocephalic and atraumatic.  Eyes:     Conjunctiva/sclera: Conjunctivae normal.  Cardiovascular:     Rate and Rhythm: Normal rate.  Pulmonary:     Effort: Pulmonary effort is normal.  Neurological:     Mental Status: He is alert.  Psychiatric:        Mood and Affect: Mood normal.        Behavior: Behavior normal.        Thought Content: Thought content normal.      UC Treatments / Results  Labs (all labs ordered are listed, but only abnormal results are displayed) Labs Reviewed - No data to  display  EKG   Radiology No results found.  Procedures Procedures (including critical care time)  Medications Ordered in UC Medications - No data to display  Initial Impression / Assessment and Plan / UC Course  I have reviewed the triage vital signs and the nursing notes.  Pertinent labs & imaging results that were available during my care of the patient were reviewed by me and considered in my medical decision making (see chart for details).     HCTZ refilled at previous dosing.  Recommended follow-up with PCP.  Patient made appointment for establishing care next month.  Encouraged sooner follow-up with any further concerns.   Final Clinical Impressions(s) / UC Diagnoses   Final diagnoses:  Essential hypertension   Discharge Instructions   None    ED Prescriptions     Medication Sig Dispense Auth. Provider   hydrochlorothiazide (HYDRODIURIL) 12.5 MG tablet Take 1 tablet (12.5 mg total) by mouth daily. 30 tablet Tomi Bamberger, PA-C      PDMP not reviewed this encounter.   Tomi Bamberger, PA-C 11/07/23 1843

## 2023-11-26 ENCOUNTER — Ambulatory Visit: Payer: Self-pay | Admitting: Family Medicine

## 2023-12-19 ENCOUNTER — Ambulatory Visit: Payer: Self-pay | Admitting: Family Medicine

## 2023-12-31 ENCOUNTER — Ambulatory Visit (INDEPENDENT_AMBULATORY_CARE_PROVIDER_SITE_OTHER): Payer: Self-pay | Admitting: Internal Medicine

## 2023-12-31 ENCOUNTER — Encounter: Payer: Self-pay | Admitting: Internal Medicine

## 2023-12-31 VITALS — BP 122/90 | HR 84 | Temp 98.3°F | Ht 76.0 in | Wt 313.4 lb

## 2023-12-31 DIAGNOSIS — R7301 Impaired fasting glucose: Secondary | ICD-10-CM

## 2023-12-31 DIAGNOSIS — I10 Essential (primary) hypertension: Secondary | ICD-10-CM

## 2023-12-31 DIAGNOSIS — D869 Sarcoidosis, unspecified: Secondary | ICD-10-CM

## 2023-12-31 DIAGNOSIS — Z860101 Personal history of adenomatous and serrated colon polyps: Secondary | ICD-10-CM

## 2023-12-31 DIAGNOSIS — Z1159 Encounter for screening for other viral diseases: Secondary | ICD-10-CM

## 2023-12-31 DIAGNOSIS — E669 Obesity, unspecified: Secondary | ICD-10-CM

## 2023-12-31 DIAGNOSIS — Z114 Encounter for screening for human immunodeficiency virus [HIV]: Secondary | ICD-10-CM

## 2023-12-31 LAB — COMPREHENSIVE METABOLIC PANEL
ALT: 14 U/L (ref 0–53)
AST: 30 U/L (ref 0–37)
Albumin: 4.3 g/dL (ref 3.5–5.2)
Alkaline Phosphatase: 72 U/L (ref 39–117)
BUN: 11 mg/dL (ref 6–23)
CO2: 26 meq/L (ref 19–32)
Calcium: 9.2 mg/dL (ref 8.4–10.5)
Chloride: 104 meq/L (ref 96–112)
Creatinine, Ser: 0.84 mg/dL (ref 0.40–1.50)
GFR: 100.87 mL/min (ref >60.00)
Glucose, Bld: 104 mg/dL — ABNORMAL HIGH (ref 70–99)
Potassium: 3.5 meq/L (ref 3.5–5.1)
Sodium: 140 meq/L (ref 135–145)
Total Bilirubin: 0.4 mg/dL (ref 0.2–1.2)
Total Protein: 7.7 g/dL (ref 6.0–8.3)

## 2023-12-31 LAB — TSH: TSH: 2.11 u[IU]/mL (ref 0.35–5.50)

## 2023-12-31 LAB — LIPID PANEL
Cholesterol: 190 mg/dL (ref 0–200)
HDL: 40 mg/dL (ref >39.00)
LDL Cholesterol: 135 mg/dL — ABNORMAL HIGH (ref 0–99)
NonHDL: 150.24
Total CHOL/HDL Ratio: 5
Triglycerides: 75 mg/dL (ref 0.0–149.0)
VLDL: 15 mg/dL (ref 0.0–40.0)

## 2023-12-31 MED ORDER — HYDROCHLOROTHIAZIDE 12.5 MG PO CAPS: 12.5000 mg | ORAL_CAPSULE | Freq: Every day | ORAL | 1 refills | Status: DC

## 2023-12-31 NOTE — Progress Notes (Signed)
[facility] [address] [place] [address] Dept: [phone] Dept Fax: [phone]  New Patient Office Visit  Subjective:   Aaron Jacobs December 10, 1971 12/31/2023  Chief Complaint  Patient presents with   Establish Care   Medication Refill    HPI: Aaron Jacobs presents today to establish care at [facility]. Introduced to Publishing rights manager role and practice setting.  All questions answered.  Concerns: See below   Discussed the use of AI scribe software for clinical note transcription with the patient, who gave verbal consent to proceed.  History of Present Illness   The patient, with a history of high blood pressure and sarcoidosis, presents for a new patient appointment. He has been taking hydrochlorothiazide 12.5mg  daily for blood pressure management but recently ran out of medication and borrowed some from a family member. The patient's blood pressure readings at home typically range from 130s/80s to 140s/90s. He denies any associated symptoms such as headaches or blurry vision.  The patient also has a history of a colon polyp found in 2023 and is due for a follow-up colonoscopy in 7-8 years. He has not smoked since 1995 and tries to maintain a low-sodium diet. The patient had a biopsy in 1998 to diagnose sarcoidosis and has not had any flare-ups since 2012. He prefers the capsule form of his blood pressure medication over the tablet form. The patient has not had a physical since December 2023. He has been trying to lose weight to help manage his blood pressure and has noticed some weight loss recently.       Wt Readings from Last 3 Encounters:  12/31/23 (!) 313 lb 6.4 oz (142.2 kg)  12/01/21 (!) 310 lb (140.6 kg)  11/17/21 (!) 310 lb (140.6 kg)     The following portions of the patient's history were reviewed and updated as appropriate: past medical history, past surgical history,  family history, social history, allergies, medications, and problem list.   Patient Active Problem List   Diagnosis Date Noted   Hx of adenomatous polyp of colon 12/12/2021   SARCOIDOSIS 04/20/2010   Essential hypertension 04/20/2010   Past Medical History:  Diagnosis Date   Allergy    Hx of adenomatous polyp of colon 12/12/2021   Hypertension    Sarcoidosis    History reviewed. No pertinent surgical history. Family History  Problem Relation Age of Onset   Breast cancer Sister    Colon cancer Neg Hx    Colon polyps Neg Hx     Current Outpatient Medications:    fluticasone (FLONASE) 50 MCG/ACT nasal spray, Place 2 sprays into both nostrils daily., Disp: , Rfl:    hydrochlorothiazide (MICROZIDE) 12.5 MG capsule, Take 1 capsule (12.5 mg total) by mouth daily., Disp: 90 capsule, Rfl: 1   ibuprofen (ADVIL,MOTRIN) 800 MG tablet, Take 1 tablet (800 mg total) by mouth 3 (three) times daily., Disp: 21 tablet, Rfl: 0 No Known Allergies  ROS: A complete ROS was performed with pertinent positives/negatives noted in the HPI. The remainder of the ROS are negative.   Objective:   Today's Vitals   12/31/23 1314  BP: (!) 136/92  Pulse: 84  Temp: 98.3 F (36.8 C)  TempSrc: Temporal  SpO2: 99%  Weight: (!) 313 lb 6.4 oz (142.2 kg)  Height: 6\' 4"  (1.93 m)    GENERAL: Well-appearing, in NAD. Well nourished.  SKIN: Pink, warm and dry. No rash, lesion, ulceration, or ecchymoses.  NECK:  Trachea midline. Full ROM w/o pain or tenderness. No lymphadenopathy.  RESPIRATORY: Chest wall symmetrical. Respirations even and non-labored. Breath sounds clear to auscultation bilaterally.  CARDIAC: S1, S2 present, regular rate and rhythm. Peripheral pulses 2+ bilaterally.  EXTREMITIES: Without clubbing, cyanosis, or edema.  NEUROLOGIC: No motor or sensory deficits. Steady, even gait.  PSYCH/MENTAL STATUS: Alert, oriented x 3. Cooperative, appropriate mood and affect.   Health Maintenance Due  Topic  Date Due   HIV Screening  Never done   Hepatitis C Screening  Never done   Zoster Vaccines- Shingrix (1 of 2) Never done    No results found for any visits on 12/31/23.  Assessment & Plan:  Assessment and Plan    Hypertension Blood pressure elevated at 136/92 in the office, with home readings typically in the 130s/90s. Patient has been inconsistently taking hydrochlorothiazide 12.5mg  due to running out of medication. No symptoms of hypertensive urgency/emergency reported. -Continue hydrochlorothiazide 12.5mg  daily, ensuring consistent use. -Check blood pressure at home a few hours after taking medication and maintain a log. -Order labs to check kidney function and electrolytes due to hydrochlorothiazide use. -Recheck blood pressure in office in 1 month.  Obesity Patient reports recent weight loss from 320lbs to 313lbs and has made some dietary changes. Discussed the potential impact of further weight loss on blood pressure control. -Encourage continued dietary modifications and regular exercise. -Goal to potentially reduce or discontinue antihypertensive medication with significant weight loss.  Sarcoidosis No recent flare-ups, last checked in 2012. -No current action required, continue monitoring.  Colon Polyp Polyp removed in 2023, advised to return for repeat colonoscopy in 7-8 years. -No current action required, continue monitoring.  General Health Maintenance -Order labs to check thyroid function and cholesterol levels. -Screen for HIV and Hepatitis C as part of routine care.       Orders Placed This Encounter  Procedures   Comprehensive metabolic panel   Lipid panel   TSH   Hepatitis C antibody   HIV antibody (with reflex)   Meds ordered this encounter  Medications   hydrochlorothiazide (MICROZIDE) 12.5 MG capsule    Sig: Take 1 capsule (12.5 mg total) by mouth daily.    Dispense:  90 capsule    Refill:  1    Supervising Provider:   Garnette Gunner [[id number]]     Return in about 1 month (around 01/31/2024) for Hypertension.   Salvatore Decent, FNP

## 2023-12-31 NOTE — Patient Instructions (Signed)
BLOOD PRESSURE: Obtain an automatic blood pressure machine if you do not have one.   Check your blood pressure at home, write down blood pressure readings and bring to next appointment.  Goal is BP less than 140/90 consistently.  Adhere to a low salt diet ( no more than 1500mg of salt/sodium per day) and exercise regularly   The nutrition facts label is a good place to find how much sodium is in foods. Look for products with no more than 400 mg of sodium per serving.  Remember that 1.5 g = 1500 mg.  The food label may also list foods as:  Sodium-free: Less than 5 mg in a serving.  Very low sodium: 35 mg or less in a serving.  Low-sodium: 140 mg or less in a serving.  Light in sodium: 50% less sodium in a serving. For example, if a food that usually has 300 mg of sodium is changed to become light in sodium, it will have 150 mg of sodium.  Reduced sodium: 25% less sodium in a serving. For example, if a food that usually has 400 mg of sodium is changed to reduced sodium, it will have 300 mg of sodium.   

## 2024-01-01 ENCOUNTER — Encounter: Payer: Self-pay | Admitting: Internal Medicine

## 2024-01-01 ENCOUNTER — Other Ambulatory Visit: Payer: Self-pay | Admitting: Internal Medicine

## 2024-01-01 DIAGNOSIS — R7301 Impaired fasting glucose: Secondary | ICD-10-CM

## 2024-01-01 LAB — HEPATITIS C ANTIBODY: Hepatitis C Ab: NONREACTIVE

## 2024-01-01 LAB — HIV ANTIBODY (ROUTINE TESTING W REFLEX): HIV 1&2 Ab, 4th Generation: NONREACTIVE

## 2024-01-18 ENCOUNTER — Other Ambulatory Visit: Payer: Self-pay

## 2024-01-18 ENCOUNTER — Other Ambulatory Visit (INDEPENDENT_AMBULATORY_CARE_PROVIDER_SITE_OTHER): Payer: Self-pay

## 2024-01-18 DIAGNOSIS — R7301 Impaired fasting glucose: Secondary | ICD-10-CM

## 2024-01-18 LAB — HEMOGLOBIN A1C: Hgb A1c MFr Bld: 6.2 % (ref 4.6–6.5)

## 2024-01-23 ENCOUNTER — Encounter: Payer: Self-pay | Admitting: Internal Medicine

## 2024-01-23 DIAGNOSIS — R7303 Prediabetes: Secondary | ICD-10-CM | POA: Insufficient documentation

## 2024-06-24 ENCOUNTER — Other Ambulatory Visit: Payer: Self-pay | Admitting: Internal Medicine

## 2024-06-24 DIAGNOSIS — I10 Essential (primary) hypertension: Secondary | ICD-10-CM

## 2024-09-20 ENCOUNTER — Other Ambulatory Visit: Payer: Self-pay | Admitting: Internal Medicine

## 2024-09-20 DIAGNOSIS — I10 Essential (primary) hypertension: Secondary | ICD-10-CM

## 2024-10-24 ENCOUNTER — Other Ambulatory Visit: Payer: Self-pay | Admitting: Internal Medicine

## 2024-10-24 DIAGNOSIS — I10 Essential (primary) hypertension: Secondary | ICD-10-CM

## 2024-10-24 NOTE — Telephone Encounter (Signed)
 Pt is needing appointment to get refills on hydrochlorothiazide  (MICROZIDE ) 12.5 MG capsule.   Two refills we placed on 06/24/24 and 09/20/24 His last OV he was instructed to do a 1 month follow up he is needing a OV visit for further refills. LVM for him to call the office to schedule visit.

## 2024-12-09 ENCOUNTER — Encounter: Payer: Self-pay | Admitting: Internal Medicine

## 2024-12-09 VITALS — BP 122/80 | HR 92 | Temp 98.4°F | Ht 76.0 in | Wt 334.4 lb

## 2024-12-09 DIAGNOSIS — Z125 Encounter for screening for malignant neoplasm of prostate: Secondary | ICD-10-CM

## 2024-12-09 DIAGNOSIS — Z0000 Encounter for general adult medical examination without abnormal findings: Secondary | ICD-10-CM

## 2024-12-09 DIAGNOSIS — I10 Essential (primary) hypertension: Secondary | ICD-10-CM

## 2024-12-09 DIAGNOSIS — Z131 Encounter for screening for diabetes mellitus: Secondary | ICD-10-CM

## 2024-12-09 LAB — HEMOGLOBIN A1C: Hgb A1c MFr Bld: 6.1 % (ref 4.6–6.5)

## 2024-12-09 LAB — CBC WITH DIFFERENTIAL/PLATELET
Basophils Absolute: 0 K/uL (ref 0.0–0.1)
Basophils Relative: 0.3 % (ref 0.0–3.0)
Eosinophils Absolute: 0.2 K/uL (ref 0.0–0.7)
Eosinophils Relative: 4.6 % (ref 0.0–5.0)
HCT: 37.3 % — ABNORMAL LOW (ref 39.0–52.0)
Hemoglobin: 12.2 g/dL — ABNORMAL LOW (ref 13.0–17.0)
Lymphocytes Relative: 43.3 % (ref 12.0–46.0)
Lymphs Abs: 2.3 K/uL (ref 0.7–4.0)
MCHC: 32.6 g/dL (ref 30.0–36.0)
MCV: 76.5 fl — ABNORMAL LOW (ref 78.0–100.0)
Monocytes Absolute: 0.5 K/uL (ref 0.1–1.0)
Monocytes Relative: 8.5 % (ref 3.0–12.0)
Neutro Abs: 2.3 K/uL (ref 1.4–7.7)
Neutrophils Relative %: 43.3 % (ref 43.0–77.0)
Platelets: 135 K/uL — ABNORMAL LOW (ref 150.0–400.0)
RBC: 4.88 Mil/uL (ref 4.22–5.81)
RDW: 15.2 % (ref 11.5–15.5)
WBC: 5.4 K/uL (ref 4.0–10.5)

## 2024-12-09 LAB — COMPREHENSIVE METABOLIC PANEL WITH GFR
ALT: 20 U/L (ref 3–53)
AST: 33 U/L (ref 5–37)
Albumin: 4.1 g/dL (ref 3.5–5.2)
Alkaline Phosphatase: 76 U/L (ref 39–117)
BUN: 14 mg/dL (ref 6–23)
CO2: 32 meq/L (ref 19–32)
Calcium: 9 mg/dL (ref 8.4–10.5)
Chloride: 103 meq/L (ref 96–112)
Creatinine, Ser: 0.9 mg/dL (ref 0.40–1.50)
GFR: 98.13 mL/min
Glucose, Bld: 90 mg/dL (ref 70–99)
Potassium: 3.8 meq/L (ref 3.5–5.1)
Sodium: 141 meq/L (ref 135–145)
Total Bilirubin: 0.3 mg/dL (ref 0.2–1.2)
Total Protein: 6.7 g/dL (ref 6.0–8.3)

## 2024-12-09 LAB — PSA: PSA: 0.69 ng/mL (ref 0.10–4.00)

## 2024-12-09 MED ORDER — HYDROCHLOROTHIAZIDE 12.5 MG PO CAPS: 12.5000 mg | ORAL_CAPSULE | Freq: Every day | ORAL | 3 refills | Status: AC

## 2024-12-09 NOTE — Progress Notes (Addendum)
 "  Subjective:   Aaron Jacobs Oct 08, 1971 12/09/2024  CC: Chief Complaint  Patient presents with   Annual Exam    Not fasting,     HPI: Aaron Jacobs is a 53 y.o. male who presents for a routine health maintenance exam.  Labs collected at time of visit. Not fasting - held on lipid panel.  Doing well. No concerns   HEALTH SCREENINGS: - PSA (50+): Ordered today  No results found for: PSA1, PSA   - Colonoscopy (45+): Up to date  - AAA Screening: Not applicable  Men age 66-75 who have ever smoked - Lung Cancer screening with low-dose CT: Not applicable-  Adults age 22-80 who are current cigarette smokers or quit within the last 15 years. Must have 20 pack year history.   IMMUNIZATIONS:  - Tdap: Tetanus vaccination status reviewed: last tetanus booster within 10 years. - Influenza: declined - Prevnar: discussed - Zostavax vaccine (50+): discussed - patient will consider getting at pharmacy , he is self pay   Past medical history, surgical history, medications, allergies, family history and social history reviewed with patient today and changes made to appropriate areas of the chart.   Social History   Socioeconomic History   Marital status: Married    Spouse name: Not on file   Number of children: Not on file   Years of education: Not on file   Highest education level: Some college, no degree  Occupational History   Not on file  Tobacco Use   Smoking status: Never   Smokeless tobacco: Never  Substance and Sexual Activity   Alcohol use: Yes    Comment: occassionally   Drug use: No   Sexual activity: Yes  Other Topics Concern   Not on file  Social History Narrative   Not on file   Social Drivers of Health   Tobacco Use: Low Risk (12/09/2024)   Patient History    Smoking Tobacco Use: Never    Smokeless Tobacco Use: Never    Passive Exposure: Not on file  Financial Resource Strain: Low Risk (12/08/2024)   Overall Financial Resource Strain (CARDIA)     Difficulty of Paying Living Expenses: Not very hard  Food Insecurity: No Food Insecurity (12/08/2024)   Epic    Worried About Radiation Protection Practitioner of Food in the Last Year: Never true    Ran Out of Food in the Last Year: Never true  Transportation Needs: No Transportation Needs (12/08/2024)   Epic    Lack of Transportation (Medical): No    Lack of Transportation (Non-Medical): No  Physical Activity: Insufficiently Active (12/08/2024)   Exercise Vital Sign    Days of Exercise per Week: 2 days    Minutes of Exercise per Session: 30 min  Stress: No Stress Concern Present (12/08/2024)   [employer] of Occupational Health - Occupational Stress Questionnaire    Feeling of Stress: Only a little  Social Connections: Socially Integrated (12/08/2024)   Social Connection and Isolation Panel    Frequency of Communication with Friends and Family: More than three times a week    Frequency of Social Gatherings with Friends and Family: Twice a week    Attends Religious Services: 1 to 4 times per year    Active Member of [employer] or Organizations: Yes    Attends Banker Meetings: More than 4 times per year    Marital Status: Married  Catering Manager Violence: Not on file  Depression (PHQ2-9): Low Risk (12/09/2024)   Depression (  PHQ2-9)    PHQ-2 Score: 2  Alcohol Screen: Low Risk (12/08/2024)   Alcohol Screen    Last Alcohol Screening Score (AUDIT): 4  Housing: Unknown (12/08/2024)   Epic    Unable to Pay for Housing in the Last Year: No    Number of Times Moved in the Last Year: Not on file    Homeless in the Last Year: No  Utilities: Not on file  Health Literacy: Not on file    Past Medical History:  Diagnosis Date   Allergy    Hx of adenomatous polyp of colon 12/12/2021   Hypertension    Sarcoidosis     History reviewed. No pertinent surgical history.  Current Outpatient Medications on File Prior to Visit  Medication Sig   ibuprofen  (ADVIL ,MOTRIN ) 800 MG tablet Take 1  tablet (800 mg total) by mouth 3 (three) times daily.   No current facility-administered medications on file prior to visit.    Allergies[1]  Family History  Problem Relation Age of Onset   Breast cancer Sister    Colon cancer Neg Hx    Colon polyps Neg Hx      ROS: Denies fever, fatigue, unexplained weight loss/gain, hearing or vision changes, cardiac or respiratory complaints. Denies neurological deficits, musculoskeletal complaints, gastrointestinal or genitourinary complaints, mental health complaints, and skin changes.   Objective:   Today's Vitals   12/09/24 1410  BP: 122/80  Pulse: 92  Temp: 98.4 F (36.9 C)  TempSrc: Temporal  SpO2: 98%  Weight: (!) 334 lb 6.4 oz (151.7 kg)  Height: 6' 4 (1.93 m)    GENERAL APPEARANCE: Well-appearing, in NAD. Well nourished.  SKIN: Pink, warm and dry. Turgor normal. No rash, lesion, ulceration, or ecchymoses. Hair evenly distributed.  HEENT: HEAD: Normocephalic.  EYES: PERRLA. EOMI. Lids intact w/o defect. Sclera white, Conjunctiva pink w/o exudate.  EARS: External ear w/o redness, swelling, masses or lesions. EAC clear. TM's intact, translucent w/o bulging, appropriate landmarks visualized. Appropriate acuity to conversational tones.  NOSE: Septum midline w/o deformity. Nares patent, mucosa pink and non-inflamed w/o drainage.  THROAT: Uvula midline. Oropharynx clear. Tonsils  non-inflamed w/o exudate. Oral mucosa pink and moist.  NECK: Supple, Trachea midline. Full ROM w/o pain or tenderness. No lymphadenopathy. Thyroid non-tender w/o enlargement or palpable masses.  RESPIRATORY: Chest wall symmetrical w/o masses. Respirations even and non-labored. Breath sounds clear to auscultation bilaterally. No wheezes, rales, rhonchi, or crackles. CARDIAC: S1, S2 present, regular rate and rhythm. No gallops, murmurs, rubs, or clicks. No carotid bruits. Capillary refill <2 seconds. Peripheral pulses 2+ bilaterally. GI: Abdomen soft w/o  distention. Normoactive bowel sounds. No palpable masses or tenderness. No guarding or rebound tenderness. Liver and spleen w/o tenderness or enlargement. No CVA tenderness.  GU:  deferred exam. MSK: Muscle tone and strength appropriate for age, w/o atrophy or abnormal movement. EXTREMITIES: Active ROM intact, w/o tenderness, crepitus, or contracture. No obvious joint deformities or effusions. No clubbing, edema, or cyanosis.  NEUROLOGIC: CN's II-XII intact. Motor strength symmetrical with no obvious weakness. No sensory deficits.  Steady, even gait.  PSYCH/MENTAL STATUS: Alert, oriented x 3. Cooperative, appropriate mood and affect.   Results for orders placed or performed in visit on 01/18/24  Hemoglobin A1c   Collection Time: 01/18/24  8:50 AM  Result Value Ref Range   Hgb A1c MFr Bld 6.2 4.6 - 6.5 %    Depression and Anxiety Screen done today and results listed below:     12/09/2024    2:59  PM 12/31/2023    1:44 PM  Depression screen PHQ 2/9  Decreased Interest 0 0  Down, Depressed, Hopeless 0 0  PHQ - 2 Score 0 0  Altered sleeping 1 0  Tired, decreased energy 1 0  Change in appetite 0 0  Feeling bad or failure about yourself  0 0  Trouble concentrating 0 0  Moving slowly or fidgety/restless 0 0  Suicidal thoughts 0 0  PHQ-9 Score 2 0   Difficult doing work/chores Not difficult at all Not difficult at all     Data saved with a previous flowsheet row definition      12/09/2024    2:59 PM 12/31/2023    1:44 PM  GAD 7 : Generalized Anxiety Score  Nervous, Anxious, on Edge 0 0  Control/stop worrying 0 1  Worry too much - different things 0 0  Trouble relaxing 0 1  Restless 0 0  Easily annoyed or irritable 0 0  Afraid - awful might happen 0 0  Total GAD 7 Score 0 2  Anxiety Difficulty  Not difficult at all    Assessment & Plan:  Annual physical exam -     CBC with Differential/Platelet -     Comprehensive metabolic panel with GFR  Screening for diabetes  mellitus -     Hemoglobin A1c  Essential hypertension -     hydroCHLOROthiazide ; Take 1 capsule (12.5 mg total) by mouth daily.  Dispense: 90 capsule; Refill: 3  Prostate cancer screening -     PSA     Orders Placed This Encounter  Procedures   CBC with Differential/Platelet   Comprehensive metabolic panel with GFR   PSA   Hemoglobin A1C    PATIENT COUNSELING: - Encourage to adjust caloric intake to maintain or achieve ideal body weight, to reduce intake of dietary saturated fat and total fat, to limit sodium intake by avoiding high sodium foods and not adding table salt, and to maintain adequate dietary potassium and calcium preferably from fresh fruits, vegetables, and low-fat dairy products.   - Stress the importance of regular exercise   NEXT PREVENTATIVE PHYSICAL DUE IN 1 YEAR.  Return in about 6 months (around 06/09/2025) for Hypertension - fasting blood work at appointment (cholesterol).  Rosina Senters, FNP     [1] No Known Allergies  "

## 2024-12-15 ENCOUNTER — Encounter: Payer: Self-pay | Admitting: Internal Medicine

## 2024-12-15 DIAGNOSIS — D649 Anemia, unspecified: Secondary | ICD-10-CM

## 2024-12-16 NOTE — Telephone Encounter (Signed)
 LVM for Pt to call office for lab visit

## 2025-06-08 ENCOUNTER — Ambulatory Visit: Payer: Self-pay | Admitting: Internal Medicine
